# Patient Record
Sex: Female | Born: 1970 | Race: Black or African American | Hispanic: No | Marital: Single | Smoking: Never smoker
Health system: Southern US, Community
[De-identification: ages and names within clinical notes are randomized; demographics above are authoritative.]

## PROBLEM LIST (undated history)

## (undated) DIAGNOSIS — J45909 Unspecified asthma, uncomplicated: Secondary | ICD-10-CM

## (undated) HISTORY — DX: Unspecified asthma, uncomplicated: J45.909

## (undated) HISTORY — PX: MOUTH SURGERY: SHX715

## (undated) HISTORY — PX: CYST REMOVAL TRUNK: SHX6283

---

## 2017-02-11 DIAGNOSIS — L811 Chloasma: Secondary | ICD-10-CM | POA: Insufficient documentation

## 2017-02-11 DIAGNOSIS — T148XXA Other injury of unspecified body region, initial encounter: Secondary | ICD-10-CM | POA: Insufficient documentation

## 2017-05-06 DIAGNOSIS — L819 Disorder of pigmentation, unspecified: Secondary | ICD-10-CM | POA: Insufficient documentation

## 2018-05-27 ENCOUNTER — Telehealth: Payer: Self-pay | Admitting: Family Medicine

## 2018-05-27 NOTE — Telephone Encounter (Signed)
Copied from [place] [id number]. Topic: Appointment Scheduling - New Patient >> May 27, 2018  4:21 PM Loma Boston wrote: CRM for notification. See Telephone encounter for: 05/27/18. Dr Bryan Lemma has a N PT on >> May 27, 2018  4:25 PM Loma Boston wrote: New PT on 06/09/2018

## 2018-06-01 ENCOUNTER — Telehealth: Payer: Self-pay

## 2018-06-01 NOTE — Telephone Encounter (Signed)
Copied from [place] [id number]. Topic: Appointment Scheduling - New Patient >> May 27, 2018  4:25 PM Loma Boston wrote: New PT on 06/09/2018

## 2018-06-01 NOTE — Telephone Encounter (Signed)
Copied from [place] [id number]. Topic: Appointment Scheduling - New Patient >> May 27, 2018  4:21 PM Loma Boston wrote: CRM for notification. See Telephone encounter for: 05/27/18. Dr Bryan Lemma has a N PT on >> May 27, 2018  4:25 PM Loma Boston wrote: New PT on 06/09/2018

## 2018-06-09 ENCOUNTER — Ambulatory Visit (INDEPENDENT_AMBULATORY_CARE_PROVIDER_SITE_OTHER): Payer: BC Managed Care – PPO | Admitting: Family Medicine

## 2018-06-09 ENCOUNTER — Other Ambulatory Visit: Payer: Self-pay

## 2018-06-09 ENCOUNTER — Encounter: Payer: Self-pay | Admitting: Family Medicine

## 2018-06-09 VITALS — BP 120/78 | HR 104 | Temp 98.0°F | Ht 66.0 in | Wt 213.0 lb

## 2018-06-09 DIAGNOSIS — Z1239 Encounter for other screening for malignant neoplasm of breast: Secondary | ICD-10-CM | POA: Diagnosis not present

## 2018-06-09 DIAGNOSIS — Z2821 Immunization not carried out because of patient refusal: Secondary | ICD-10-CM | POA: Diagnosis not present

## 2018-06-09 DIAGNOSIS — Z7689 Persons encountering health services in other specified circumstances: Secondary | ICD-10-CM | POA: Diagnosis not present

## 2018-06-09 DIAGNOSIS — L732 Hidradenitis suppurativa: Secondary | ICD-10-CM

## 2018-06-09 DIAGNOSIS — L309 Dermatitis, unspecified: Secondary | ICD-10-CM | POA: Diagnosis not present

## 2018-06-09 DIAGNOSIS — Z872 Personal history of diseases of the skin and subcutaneous tissue: Secondary | ICD-10-CM

## 2018-06-09 MED ORDER — TRIAMCINOLONE ACETONIDE 0.1 % EX CREA: 1.0000 "application " | TOPICAL_CREAM | Freq: Two times a day (BID) | CUTANEOUS | 0 refills | Status: DC

## 2018-06-09 NOTE — Progress Notes (Signed)
Linda Chaney is a 48 y.o. female  Chief Complaint  Patient presents with  . Establish Care    est care/ Pt has cyst from teenager wants to discuss maybe an aluminum free cream or deodrant prescription/ cyst removed on bottom 15 years ago drainage today/ places on legs scaly very itchy and on arms/ pt states she cut her vagina when shaving and has irritation?/ last pap 2 yrs ago/ pt declined tdap and flu today    HPI: Linda Chaney is a 48 y.o. female here to establish care with our office.  She was previously seen 3 years ago at [facility].  Pt declines Tdap and flu vaccine. Pts mother developed GBS after influenza vaccine and pt states she was advised not to receive flu vaccine herself.  Specialists: Derm (Dr. Lois Huxley)   Last CPE, labs: 2017  Last PAP: 2018 Last mammo: overdue - needs and would like referral  Med refills needed today: none  Pt had pilonidal cyst removed surgically 20 years ago but states she has noted drainage from that area x 2-3 days. No pain/discomfort. Scant clear drainage on bandaid.   She has a h/o hidradenitis in B/L axilla. She has been symptom-free x 1 year. She has been using OTC aluminum free deodorant for the past month. She is looking for recommendation as to what brand if any or Rx aluminum free deodorant.  She has appt in about 2 wks with her dermatologist to discuss multiple issues  She notes "eczema patches" on her lower legs and Rt arm x a few weeks. She has been using Nivea lotion, tried OTC hydrocortisone cream. + itch.   Pt notes having sensitive skin and is not able to use topical hair removal products. She endorses shaving her pubic area and causing irritation and "breaks in the skin" that do not seem to be healing. No pain. No itch. No fever, chills.    Past Medical History:  Diagnosis Date  . Asthma     Past Surgical History:  Procedure Laterality Date  . CYST REMOVAL TRUNK      Social History   Socioeconomic  History  . Marital status: Single    Spouse name: Not on file  . Number of children: Not on file  . Years of education: Not on file  . Highest education level: Not on file  Occupational History  . Not on file  Social Needs  . Financial resource strain: Not on file  . Food insecurity:    Worry: Not on file    Inability: Not on file  . Transportation needs:    Medical: Not on file    Non-medical: Not on file  Tobacco Use  . Smoking status: Never Smoker  . Smokeless tobacco: Never Used  Substance and Sexual Activity  . Alcohol use: Yes    Comment: ocassionally  . Drug use: Never  . Sexual activity: Not on file  Lifestyle  . Physical activity:    Days per week: Not on file    Minutes per session: Not on file  . Stress: Not on file  Relationships  . Social connections:    Talks on phone: Not on file    Gets together: Not on file    Attends religious service: Not on file    Active member of club or organization: Not on file    Attends meetings of clubs or organizations: Not on file    Relationship status: Not on file  . Intimate  partner violence:    Fear of current or ex partner: Not on file    Emotionally abused: Not on file    Physically abused: Not on file    Forced sexual activity: Not on file  Other Topics Concern  . Not on file  Social History Narrative  . Not on file    Family History  Problem Relation Age of Onset  . Hypertension Mother   . Hypertension Father       There is no immunization history on file for this patient.  Outpatient Encounter Medications as of 06/09/2018  Medication Sig  . Fluocin-Hydroquinone-Tretinoin 0.01-4-0.05 % CREA Apply to affected areas on the cheek, nose, and around the mouth nightly to tolerance x 4 months  . triamcinolone cream (KENALOG) 0.1 % Apply 1 application topically 2 (two) times daily.   No facility-administered encounter medications on file as of 06/09/2018.      ROS: Pertinent positives and negatives noted in  HPI. Remainder of ROS non-contributory    No Known Allergies  BP 120/78   Pulse (!) 104   Temp 98 F (36.7 C) (Oral)   Ht 5\' 6"  (1.676 m)   Wt 213 lb (96.6 kg)   SpO2 98%   BMI 34.38 kg/m   Physical Exam  Constitutional: She is oriented to person, place, and time. She appears well-developed and well-nourished. No distress.  Neurological: She is alert and oriented to person, place, and time.  Skin: Skin is warm and dry. Rash (few discrete patches of dry skin on anterior shins with excoriation) noted.  Lower back just above gluteal cleft pt has scar/completely healed from remote pilonidal cyst excision; no erythema, warmth, drainage, TTP, no openings in skin     A/P:  1. Screening for breast cancer - MM DIGITAL SCREENING BILATERAL; Future  2. Encounter to establish care with new doctor - pt due for CPE, fasting labs - pt will schedule appt - UTD on mammo; pt states her last PAP was in 2018 and was normal and has no h/o abnormal PAP. She states she is due for a PAP and would like one done despite me reviewing screening guidelines with her. She states her insurance will cover. I am agreeable to doing a PAP at her CPE appt  3. Influenza vaccination declined by patient  4. H/O pilonidal cyst - s/p excision 20+ years ago - well-healed incision, no drainage noted  5. Hidradenitis suppurativa - controlled, no active infection or inflammation noted - pt has regular f/u with derm in 2 wks  6. Dermatitis - ? Eczema vs other - no new meds, lotion, soap, detergent, etc Rx: - triamacinolone 0.1% cream BID x 2 wks Discussed plan and reviewed medications with patient, including risks, benefits, and potential side effects. Pt expressed understand. All questions answered.

## 2019-01-17 ENCOUNTER — Telehealth: Payer: Self-pay

## 2019-01-17 NOTE — Telephone Encounter (Signed)
Copied from [place] [id number]. Topic: General - Other >> Jan 17, 2019 10:04 AM Keene Breath wrote: Reason for CRM: Patient called to request that she change her PCP to [facility] for personal reasons.  Please advise and call patient to let her know if this is possible.  CB# [phone]

## 2019-01-18 NOTE — Telephone Encounter (Signed)
Fine with me

## 2019-01-18 NOTE — Telephone Encounter (Signed)
Done

## 2019-01-18 NOTE — Telephone Encounter (Signed)
ok 

## 2019-01-18 NOTE — Telephone Encounter (Signed)
Charlotte please advise,  

## 2019-01-18 NOTE — Telephone Encounter (Signed)
Please help schedule TOC

## 2019-01-18 NOTE — Telephone Encounter (Signed)
Pt wants to transfer to [facility], Dr. Loletha Grayer is okay with transfer.

## 2019-01-25 ENCOUNTER — Encounter: Payer: BC Managed Care – PPO | Admitting: Family Medicine

## 2019-01-25 DIAGNOSIS — L732 Hidradenitis suppurativa: Secondary | ICD-10-CM | POA: Insufficient documentation

## 2019-01-25 DIAGNOSIS — L730 Acne keloid: Secondary | ICD-10-CM | POA: Insufficient documentation

## 2019-01-25 DIAGNOSIS — L700 Acne vulgaris: Secondary | ICD-10-CM | POA: Insufficient documentation

## 2019-02-01 ENCOUNTER — Encounter: Payer: Self-pay | Admitting: Nurse Practitioner

## 2019-02-01 ENCOUNTER — Ambulatory Visit (HOSPITAL_BASED_OUTPATIENT_CLINIC_OR_DEPARTMENT_OTHER): Payer: BC Managed Care – PPO

## 2019-02-01 ENCOUNTER — Other Ambulatory Visit (HOSPITAL_COMMUNITY)
Admission: RE | Admit: 2019-02-01 | Discharge: 2019-02-01 | Disposition: A | Discharge status: Home / Self Care | Payer: BC Managed Care – PPO | Source: Ambulatory Visit | Attending: Nurse Practitioner | Admitting: Nurse Practitioner

## 2019-02-01 ENCOUNTER — Encounter: Payer: BC Managed Care – PPO | Admitting: Family Medicine

## 2019-02-01 ENCOUNTER — Other Ambulatory Visit: Payer: Self-pay

## 2019-02-01 ENCOUNTER — Ambulatory Visit (INDEPENDENT_AMBULATORY_CARE_PROVIDER_SITE_OTHER): Payer: BC Managed Care – PPO | Admitting: Nurse Practitioner

## 2019-02-01 VITALS — BP 116/80 | HR 92 | Temp 97.9°F | Ht 66.0 in | Wt 214.2 lb

## 2019-02-01 DIAGNOSIS — E669 Obesity, unspecified: Secondary | ICD-10-CM | POA: Insufficient documentation

## 2019-02-01 DIAGNOSIS — Z13220 Encounter for screening for lipoid disorders: Secondary | ICD-10-CM | POA: Diagnosis not present

## 2019-02-01 DIAGNOSIS — Z0001 Encounter for general adult medical examination with abnormal findings: Secondary | ICD-10-CM

## 2019-02-01 DIAGNOSIS — R9389 Abnormal findings on diagnostic imaging of other specified body structures: Secondary | ICD-10-CM

## 2019-02-01 DIAGNOSIS — Z1231 Encounter for screening mammogram for malignant neoplasm of breast: Secondary | ICD-10-CM

## 2019-02-01 DIAGNOSIS — Z124 Encounter for screening for malignant neoplasm of cervix: Secondary | ICD-10-CM | POA: Diagnosis present

## 2019-02-01 DIAGNOSIS — N926 Irregular menstruation, unspecified: Secondary | ICD-10-CM | POA: Diagnosis not present

## 2019-02-01 DIAGNOSIS — D259 Leiomyoma of uterus, unspecified: Secondary | ICD-10-CM | POA: Insufficient documentation

## 2019-02-01 DIAGNOSIS — R1900 Intra-abdominal and pelvic swelling, mass and lump, unspecified site: Secondary | ICD-10-CM

## 2019-02-01 DIAGNOSIS — Z0000 Encounter for general adult medical examination without abnormal findings: Secondary | ICD-10-CM | POA: Insufficient documentation

## 2019-02-01 DIAGNOSIS — Z6834 Body mass index (BMI) 34.0-34.9, adult: Secondary | ICD-10-CM | POA: Diagnosis not present

## 2019-02-01 DIAGNOSIS — Z136 Encounter for screening for cardiovascular disorders: Secondary | ICD-10-CM

## 2019-02-01 LAB — CBC WITH DIFFERENTIAL/PLATELET
Basophils Absolute: 0 10*3/uL (ref 0.0–0.1)
Basophils Relative: 0.8 % (ref 0.0–3.0)
Eosinophils Absolute: 0.1 10*3/uL (ref 0.0–0.7)
Eosinophils Relative: 2.4 % (ref 0.0–5.0)
HCT: 41 % (ref 36.0–46.0)
Hemoglobin: 13.4 g/dL (ref 12.0–15.0)
Lymphocytes Relative: 30.6 % (ref 12.0–46.0)
Lymphs Abs: 1.3 10*3/uL (ref 0.7–4.0)
MCHC: 32.8 g/dL (ref 30.0–36.0)
MCV: 96.4 fl (ref 78.0–100.0)
Monocytes Absolute: 0.4 10*3/uL (ref 0.1–1.0)
Monocytes Relative: 10.7 % (ref 3.0–12.0)
Neutro Abs: 2.3 10*3/uL (ref 1.4–7.7)
Neutrophils Relative %: 55.5 % (ref 43.0–77.0)
Platelets: 304 10*3/uL (ref 150.0–400.0)
RBC: 4.25 Mil/uL (ref 3.87–5.11)
RDW: 14.4 % (ref 11.5–15.5)
WBC: 4.1 10*3/uL (ref 4.0–10.5)

## 2019-02-01 LAB — COMPREHENSIVE METABOLIC PANEL
ALT: 25 U/L (ref 0–35)
AST: 22 U/L (ref 0–37)
Albumin: 4.1 g/dL (ref 3.5–5.2)
Alkaline Phosphatase: 82 U/L (ref 39–117)
BUN: 12 mg/dL (ref 6–23)
CO2: 26 mEq/L (ref 19–32)
Calcium: 9 mg/dL (ref 8.4–10.5)
Chloride: 105 mEq/L (ref 96–112)
Creatinine, Ser: 0.63 mg/dL (ref 0.40–1.20)
GFR: 122.14 mL/min (ref >60.00)
Glucose, Bld: 94 mg/dL (ref 70–99)
Potassium: 4.3 mEq/L (ref 3.5–5.1)
Sodium: 138 mEq/L (ref 135–145)
Total Bilirubin: 0.6 mg/dL (ref 0.2–1.2)
Total Protein: 7.4 g/dL (ref 6.0–8.3)

## 2019-02-01 LAB — POCT URINE PREGNANCY: Preg Test, Ur: NEGATIVE

## 2019-02-01 LAB — LIPID PANEL
Cholesterol: 201 mg/dL — ABNORMAL HIGH (ref 0–200)
HDL: 56.6 mg/dL (ref >39.00)
LDL Cholesterol: 133 mg/dL — ABNORMAL HIGH (ref 0–99)
NonHDL: 144.04
Total CHOL/HDL Ratio: 4
Triglycerides: 54 mg/dL (ref 0.0–149.0)
VLDL: 10.8 mg/dL (ref 0.0–40.0)

## 2019-02-01 LAB — TSH: TSH: 1.51 u[IU]/mL (ref 0.35–4.50)

## 2019-02-01 NOTE — Progress Notes (Addendum)
Subjective:    Patient ID: Linda Chaney, female    DOB: Jun 26, 1970, 48 y.o.   MRN: [id number]  Patient presents today for complete physical and eval of obesity and irregular cycle  Vaginal Bleeding The patient's primary symptoms include missed menses and vaginal bleeding. The patient's pertinent negatives include no genital itching, genital lesions, genital odor, genital rash, pelvic pain or vaginal discharge. This is a chronic problem. The current episode started more than 1 year ago. The problem occurs intermittently. The problem has been unchanged. The patient is experiencing no pain. She is not pregnant. Pertinent negatives include no anorexia, back pain, constipation, diarrhea, dysuria, fever, flank pain, frequency, headaches, hematuria, joint pain, painful intercourse, rash, sore throat or urgency. The vaginal discharge was bloody. The vaginal bleeding is typical of menses. She has been passing clots. She has not been passing tissue. Nothing aggravates the symptoms. She has tried nothing for the symptoms. She is sexually active. No, her partner does not have an STD. She uses condoms for contraception. Her menstrual history has been irregular. Her past medical history is significant for menorrhagia. There is no history of endometriosis, ovarian cysts, PID, an STD or vaginosis.  menarche at age 25. 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 Cycles were heavy with clots, then became irregular in her 30s. Mother, maternal aunt and cousins with uterine fibroids. No FHx of cervical or ovarian or uterine cancer.  She has hx of adult acne, hidradenitis suppurativa, melasma and facial hair (chin). This is managed by dermatology with clindamycin and tru-lima cream. She has an OV every 3-74months.  Sexual History (orientation,birth control, marital status, STD):last PAP 2018 (normal per patient), never had mammogram, female partner, use of condoms.  Obesity: BMI of 34 Reports  difficulty with weight loss despite lifestyle modifications in last 1year Lowest weight 120lbs (age 37s) Heaviest weight: 214lbs Diet:heart healthy. Weight:  Wt Readings from Last 3 Encounters:  02/01/19 214 lb 3.2 oz (97.2 kg)  06/09/18 213 lb (96.6 kg)  Exercise:high intensity workout with trainer 4x/week  Depression/Suicide: Depression screen PHQ 2/9 02/01/2019  Decreased Interest 0  Down, Depressed, Hopeless 0  PHQ - 2 Score 0   Vision: up to date  Dental:up to date  Immunizations: (TDAP, Hep C screen, Pneumovax, Influenza, zoster)  Health Maintenance  Topic Date Due  . Tetanus Vaccine  04/10/1990  . Flu Shot  06/28/2019*  . HIV Screening  02/01/2020*  . Pap Smear  01/31/2022  *Topic was postponed. The date shown is not the original due date.   Fall Risk: Fall Risk  02/01/2019  Falls in the past year? 0    Medications and allergies reviewed with patient and updated if appropriate.  Patient Active Problem List   Diagnosis Date Noted  . Class 1 obesity with serious comorbidity and body mass index (BMI) of 34.0 to 34.9 in adult 02/01/2019  . Pelvic mass in female 02/01/2019  . Irregular menstrual cycle 02/01/2019  . Hidradenitis suppurativa 01/25/2019  . 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 Acne vulgaris 01/25/2019  . Hyperpigmentation 05/06/2017  . Melasma 02/11/2017  . Traumatic injury to skin or subcutaneous tissue 02/11/2017    Current Outpatient Medications on File Prior to Visit  Medication Sig Dispense Refill  . clindamycin (CLEOCIN T) 1 % external solution APPLY TOPICALLY TO UNDERARMS AND GROIN BID UTD    . Fluocin-Hydroquinone-Tretinoin 0.01-4-0.05 % CREA Apply to affected areas on the cheek, nose, and around the mouth nightly to tolerance  x 4 months    . triamcinolone cream (KENALOG) 0.1 % Apply 1 application topically 2 (two) times daily. (Patient not taking: Reported on 02/01/2019) 30 g 0   No current facility-administered medications on file prior to  visit.     Past Medical History:  Diagnosis Date  . Asthma     Past Surgical History:  Procedure Laterality Date  . CYST REMOVAL TRUNK      Social History   Socioeconomic History  . Marital status: Single    Spouse name: Not on file  . Number of children: 0  . Years of education: Not on file  . Highest education level: Not on file  Occupational History  . Not on file  Social Needs  . Financial resource strain: Not on file  . Food insecurity    Worry: Not on file    Inability: Not on file  . Transportation needs    Medical: Not on file    Non-medical: Not on file  Tobacco Use  . Smoking status: Never Smoker  . Smokeless tobacco: Never Used  Substance and Sexual Activity  . Alcohol use: Yes    Comment: ocassionally  . Drug use: Never  . Sexual activity: Yes    Birth control/protection: Condom  Lifestyle  . Physical activity    Days per week: Not on file    Minutes per session: Not on file  . Stress: Not on file  Relationships  . Social Herbalist on phone: Not on file    Gets together: Not on file    Attends religious service: Not on file    Active member of club or organization: Not on file    Attends meetings of clubs or organizations: Not on file    Relationship status: Not on file  Other Topics Concern  . Not on file  Social History Narrative  . Not on file    Family History  Problem Relation Age of Onset  . Hypertension Mother   . Other Mother        [name]  . Hypertension Father   . Cancer Paternal Grandfather        lung cancer due to tobacco use        Review of Systems  Constitutional: Negative for fever, malaise/fatigue and weight loss.  HENT: Negative for congestion and sore throat.   Eyes:       Negative for visual changes  Respiratory: Negative for cough and shortness of breath.   Cardiovascular: Negative for chest pain, palpitations and leg swelling.  Gastrointestinal: Negative for anorexia, blood in stool,  constipation, diarrhea and heartburn.  Genitourinary: Positive for menorrhagia, missed menses and vaginal bleeding. Negative for dysuria, flank pain, frequency, hematuria, pelvic pain, urgency and vaginal discharge.  Musculoskeletal: Negative for back pain, falls, joint pain and myalgias.  Skin: Negative for rash.  Neurological: Negative for dizziness, sensory change and headaches.  Endo/Heme/Allergies: Does not bruise/bleed easily.  Psychiatric/Behavioral: Negative for depression, substance abuse and suicidal ideas. The patient is not nervous/anxious.     Objective:   Vitals:   02/01/19 1111  BP: 116/80  Pulse: 92  Temp: 97.9 F (36.6 C)  SpO2: 96%    Body mass index is 34.57 kg/m.   Physical Examination:  Physical Exam Exam conducted with a chaperone present.  Constitutional:      General: She is not in acute distress.    Appearance: She is obese.  HENT:     Right  Ear: Tympanic membrane, ear canal and external ear normal.     Left Ear: Tympanic membrane, ear canal and external ear normal.  Eyes:     General: No scleral icterus.    Extraocular Movements: Extraocular movements intact.     Conjunctiva/sclera: Conjunctivae normal.     Pupils: Pupils are equal, round, and reactive to light.  Neck:     Musculoskeletal: Normal range of motion and neck supple.     Thyroid: No thyromegaly.  Cardiovascular:     Rate and Rhythm: Normal rate and regular rhythm.     Pulses: Normal pulses.     Heart sounds: Normal heart sounds.  Pulmonary:     Effort: Pulmonary effort is normal.     Breath sounds: Normal breath sounds.  Chest:     Chest wall: No tenderness.     Breasts:        Right: Normal.        Left: Normal.  Abdominal:     General: Bowel sounds are normal. There is no distension.     Palpations: Abdomen is soft. There is mass.     Tenderness: There is no abdominal tenderness. There is no right CVA tenderness, left CVA tenderness or guarding.     Hernia: No hernia is  present. There is no hernia in the left inguinal area or right inguinal area.  Genitourinary:    Labia:        Right: Lesion present. No tenderness.        Left: Lesion present. No tenderness.      Vagina: Bleeding present. No tenderness.     Cervix: Normal.     Uterus: Enlarged and fixed.      Adnexa: Right adnexa normal and left adnexa normal.  Musculoskeletal: Normal range of motion.        General: No tenderness.     Right lower leg: No edema.     Left lower leg: Edema present.  Lymphadenopathy:     Cervical: No cervical adenopathy.     Upper Body:     Right upper body: No supraclavicular, axillary or pectoral adenopathy.     Left upper body: No supraclavicular, axillary or pectoral adenopathy.     Lower Body: No right inguinal adenopathy. No left inguinal adenopathy.  Skin:    General: Skin is warm and dry.     Findings: Lesion present. No erythema.     Comments: Scarring in axilla and groin region.   Neurological:     Mental Status: She is alert and oriented to person, place, and time.  Psychiatric:        Judgment: Judgment normal.    ASSESSMENT and PLAN:  Christasia was seen today for annual exam.  Diagnoses and all orders for this visit:  Encounter for preventative adult health care exam with abnormal findings -     Cancel: CBC -     Comprehensive metabolic panel -     Lipid panel -     TSH -     Cytology - PAP( [place]) -     CBC w/Diff -     Cancel: MM DIGITAL SCREENING BILATERAL; Future -     MM 3D SCREEN BREAST BILATERAL; Future  Encounter for lipid screening for cardiovascular disease -     Lipid panel  Encounter for Papanicolaou smear for cervical cancer screening -     Cytology - PAP( [place])  Irregular menstrual cycle -     POCT urine pregnancy -  US Pelvic Complete With Transvaginal; Future -     Iron, TIBC and Ferritin Panel -     MR Pelvis W Wo Contrast; Future  Pelvic mass in female -     US Pelvic Complete With Transvaginal;  Future -     MR Pelvis W Wo Contrast; Future  Class 1 obesity with serious comorbidity and body mass index (BMI) of 34.0 to 34.9 in adult, unspecified obesity type -     Vitamin D 1,25 dihydroxy  Breast cancer screening by mammogram -     Cancel: MM DIGITAL SCREENING BILATERAL; Future -     MM 3D SCREEN BREAST BILATERAL; Future  Abnormal pelvic ultrasound -     MR Pelvis W Wo Contrast; Future    Irregular menstrual cycle menarche at age 89. 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 Cycles were heavy with clots, then became irregular in her 30s. Mother, maternal aunt and cousins with uterine fibroids. No FHx of cervical or ovarian or uterine cancer.  Negative urine pregnancy today Pelvic US ordered Consider testosterone, estrogen check?  Pelvic mass in female Palpable in suprapubic region, non tender Mother, maternal aunt and cousins with uterine fibroids. No FHx of cervical or ovarian or uterine cancer.  Negative urine pregnancy. Pelvic US ordered Consider referral to GYN?  Class 1 obesity with serious comorbidity and body mass index (BMI) of 34.0 to 34.9 in adult BMI of 34 Reports difficulty with weight loss despite lifestyle modifications in last 1year Lowest weight 120lbs (age 33s) Heaviest weight: 214lbs Diet:heart healthy. Weight:  Wt Readings from Last 3 Encounters:  02/01/19 214 lb 3.2 oz (97.2 kg)  06/09/18 213 lb (96.6 kg)  Exercise:high intensity workout with trainer 4x/week hx of adult acne, hidradenitis suppurativa, melasma and facial hair. managed by dermatology with clindamycin and tru-lima cream. She has an OV every 3-43months.  Cmp, tsh, and lipid panel check today Consider hgbA1c if elevated glucose, testosterone, and estrogen. Pelvic US ordered today.      Problem List Items Addressed This Visit      Other   Class 1 obesity with serious comorbidity and body mass index (BMI) of 34.0 to 34.9 in adult    BMI of 34  Reports difficulty with weight loss despite lifestyle modifications in last 1year Lowest weight 120lbs (age 43s) Heaviest weight: 214lbs Diet:heart healthy. Weight:  Wt Readings from Last 3 Encounters:  02/01/19 214 lb 3.2 oz (97.2 kg)  06/09/18 213 lb (96.6 kg)  Exercise:high intensity workout with trainer 4x/week hx of adult acne, hidradenitis suppurativa, melasma and facial hair. managed by dermatology with clindamycin and tru-lima cream. She has an OV every 3-1months.  Cmp, tsh, and lipid panel check today Consider hgbA1c if elevated glucose, testosterone, and estrogen. Pelvic US ordered today.      Relevant Orders   Vitamin D 1,25 dihydroxy (Completed)   Irregular menstrual cycle    menarche at age 48. 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 Cycles were heavy with clots, then became irregular in her 30s. Mother, maternal aunt and cousins with uterine fibroids. No FHx of cervical or ovarian or uterine cancer.  Negative urine pregnancy today Pelvic US ordered Consider testosterone, estrogen check?      Relevant Orders   POCT urine pregnancy (Completed)   US Pelvic Complete With Transvaginal (Completed)   Iron, TIBC and Ferritin Panel (Completed)   MR Pelvis W Wo Contrast   Pelvic mass in  female    Palpable in suprapubic region, non tender Mother, maternal aunt and cousins with uterine fibroids. No FHx of cervical or ovarian or uterine cancer.  Negative urine pregnancy. Pelvic US ordered Consider referral to GYN?      Relevant Orders   US Pelvic Complete With Transvaginal (Completed)   MR Pelvis W Wo Contrast    Other Visit Diagnoses    Encounter for preventative adult health care exam with abnormal findings    -  Primary   Relevant Orders   Comprehensive metabolic panel (Completed)   Lipid panel (Completed)   TSH (Completed)   Cytology - PAP( [place]) (Completed)   CBC w/Diff (Completed)   MM 3D SCREEN BREAST BILATERAL  (Completed)   Encounter for lipid screening for cardiovascular disease       Relevant Orders   Lipid panel (Completed)   Encounter for Papanicolaou smear for cervical cancer screening       Relevant Orders   Cytology - PAP( [place]) (Completed)   Breast cancer screening by mammogram       Relevant Orders   MM 3D SCREEN BREAST BILATERAL (Completed)   Abnormal pelvic ultrasound       Relevant Orders   MR Pelvis W Wo Contrast      Follow up: Return if symptoms worsen or fail to improve.  Wilfred Lacy, NP

## 2019-02-01 NOTE — Patient Instructions (Addendum)
Thank you for choosing [facility] for your heal care needs.  Go to lab for blood draw. You will be contacted to schedule appt for pelvic US.   Health Maintenance, Female Adopting a healthy lifestyle and getting preventive care are important in promoting health and wellness. Ask your health care provider about:  The right schedule for you to have regular tests and exams.  Things you can do on your own to prevent diseases and keep yourself healthy. What should I know about diet, weight, and exercise? Eat a healthy diet   Eat a diet that includes plenty of vegetables, fruits, low-fat dairy products, and lean protein.  Do not eat a lot of foods that are high in solid fats, added sugars, or sodium. Maintain a healthy weight Body mass index (BMI) is used to identify weight problems. It estimates body fat based on height and weight. Your health care provider can help determine your BMI and help you achieve or maintain a healthy weight. Get regular exercise Get regular exercise. This is one of the most important things you can do for your health. Most adults should:  Exercise for at least 150 minutes each week. The exercise should increase your heart rate and make you sweat (moderate-intensity exercise).  Do strengthening exercises at least twice a week. This is in addition to the moderate-intensity exercise.  Spend less time sitting. Even light physical activity can be beneficial. Watch cholesterol and blood lipids Have your blood tested for lipids and cholesterol at 47 years of age, then have this test every 5 years. Have your cholesterol levels checked more often if:  Your lipid or cholesterol levels are high.  You are older than 47 years of age.  You are at high risk for heart disease. What should I know about cancer screening? Depending on your health history and family history, you may need to have cancer screening at various ages. This may include screening for:  Breast  cancer.  Cervical cancer.  Colorectal cancer.  Skin cancer.  Lung cancer. What should I know about heart disease, diabetes, and high blood pressure? Blood pressure and heart disease  High blood pressure causes heart disease and increases the risk of stroke. This is more likely to develop in people who have high blood pressure readings, are of African descent, or are overweight.  Have your blood pressure checked: ? Every 3-5 years if you are 29-25 years of age. ? Every year if you are 57 years old or older. Diabetes Have regular diabetes screenings. This checks your fasting blood sugar level. Have the screening done:  Once every three years after age 36 if you are at a normal weight and have a low risk for diabetes.  More often and at a younger age if you are overweight or have a high risk for diabetes. What should I know about preventing infection? Hepatitis B If you have a higher risk for hepatitis B, you should be screened for this virus. Talk with your health care provider to find out if you are at risk for hepatitis B infection. Hepatitis C Testing is recommended for:  Everyone born from [birth year] through 1964.  Anyone with known risk factors for hepatitis C. Sexually transmitted infections (STIs)  Get screened for STIs, including gonorrhea and chlamydia, if: ? You are sexually active and are younger than 47 years of age. ? You are older than 47 years of age and your health care provider tells you that you are at risk for this type  of infection. ? Your sexual activity has changed since you were last screened, and you are at increased risk for chlamydia or gonorrhea. Ask your health care provider if you are at risk.  Ask your health care provider about whether you are at high risk for HIV. Your health care provider may recommend a prescription medicine to help prevent HIV infection. If you choose to take medicine to prevent HIV, you should first get tested for HIV. You should  then be tested every 3 months for as long as you are taking the medicine. Pregnancy  If you are about to stop having your period (premenopausal) and you may become pregnant, seek counseling before you get pregnant.  Take 400 to 800 micrograms (mcg) of folic acid every day if you become pregnant.  Ask for birth control (contraception) if you want to prevent pregnancy. Osteoporosis and menopause Osteoporosis is a disease in which the bones lose minerals and strength with aging. This can result in bone fractures. If you are 80 years old or older, or if you are at risk for osteoporosis and fractures, ask your health care provider if you should:  Be screened for bone loss.  Take a calcium or vitamin D supplement to lower your risk of fractures.  Be given hormone replacement therapy (HRT) to treat symptoms of menopause. Follow these instructions at home: Lifestyle  Do not use any products that contain nicotine or tobacco, such as cigarettes, e-cigarettes, and chewing tobacco. If you need help quitting, ask your health care provider.  Do not use street drugs.  Do not share needles.  Ask your health care provider for help if you need support or information about quitting drugs. Alcohol use  Do not drink alcohol if: ? Your health care provider tells you not to drink. ? You are pregnant, may be pregnant, or are planning to become pregnant.  If you drink alcohol: ? Limit how much you use to 0-1 drink a day. ? Limit intake if you are breastfeeding.  Be aware of how much alcohol is in your drink. In the [place] one drink equals one 12 oz bottle of beer (355 mL), one 5 oz glass of wine (148 mL), or one 1 oz glass of hard liquor (44 mL). General instructions  Schedule regular health, dental, and eye exams.  Stay current with your vaccines.  Tell your health care provider if: ? You often feel depressed. ? You have ever been abused or do not feel safe at home. Summary  Adopting a  healthy lifestyle and getting preventive care are important in promoting health and wellness.  Follow your health care provider's instructions about healthy diet, exercising, and getting tested or screened for diseases.  Follow your health care provider's instructions on monitoring your cholesterol and blood pressure. This information is not intended to replace advice given to you by your health care provider. Make sure you discuss any questions you have with your health care provider. Document Released: 09/29/2010 Document Revised: 03/09/2018 Document Reviewed: 03/09/2018 Elsevier Patient Education  2020 [employer].

## 2019-02-01 NOTE — Assessment & Plan Note (Signed)
BMI of 34 Reports difficulty with weight loss despite lifestyle modifications in last 1year Lowest weight 120lbs (age 59s) Heaviest weight: 214lbs Diet:heart healthy. Weight:  Wt Readings from Last 3 Encounters:  02/01/19 214 lb 3.2 oz (97.2 kg)  06/09/18 213 lb (96.6 kg)  Exercise:high intensity workout with trainer 4x/week hx of adult acne, hidradenitis suppurativa, melasma and facial hair. managed by dermatology with clindamycin and tru-lima cream. She has an OV every 3-86months.  Cmp, tsh, and lipid panel check today Consider hgbA1c if elevated glucose, testosterone, and estrogen. Pelvic US ordered today.

## 2019-02-01 NOTE — Assessment & Plan Note (Signed)
menarche at age 48. 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 Cycles were heavy with clots, then became irregular in her 30s. Mother, maternal aunt and cousins with uterine fibroids. No FHx of cervical or ovarian or uterine cancer.  Negative urine pregnancy today Pelvic US ordered Consider testosterone, estrogen check?

## 2019-02-01 NOTE — Assessment & Plan Note (Signed)
Palpable in suprapubic region, non tender Mother, maternal aunt and cousins with uterine fibroids. No FHx of cervical or ovarian or uterine cancer.  Negative urine pregnancy. Pelvic US ordered Consider referral to GYN?

## 2019-02-03 LAB — CYTOLOGY - PAP
Comment: NEGATIVE
Diagnosis: NEGATIVE
High risk HPV: NEGATIVE

## 2019-02-05 LAB — IRON,TIBC AND FERRITIN PANEL
%SAT: 39 % (calc) (ref 16–45)
Ferritin: 127 ng/mL (ref 16–232)
Iron: 122 ug/dL (ref 40–190)
TIBC: 315 mcg/dL (calc) (ref 250–450)

## 2019-02-05 LAB — VITAMIN D 1,25 DIHYDROXY
Vitamin D 1, 25 (OH)2 Total: 57 pg/mL (ref 18–72)
Vitamin D2 1, 25 (OH)2: <8 pg/mL
Vitamin D3 1, 25 (OH)2: 57 pg/mL

## 2019-02-06 ENCOUNTER — Encounter: Payer: Self-pay | Admitting: Nurse Practitioner

## 2019-02-07 ENCOUNTER — Other Ambulatory Visit: Payer: Self-pay

## 2019-02-07 ENCOUNTER — Encounter (HOSPITAL_BASED_OUTPATIENT_CLINIC_OR_DEPARTMENT_OTHER): Payer: Self-pay

## 2019-02-07 ENCOUNTER — Other Ambulatory Visit: Payer: Self-pay | Admitting: Nurse Practitioner

## 2019-02-07 ENCOUNTER — Ambulatory Visit (HOSPITAL_BASED_OUTPATIENT_CLINIC_OR_DEPARTMENT_OTHER)
Admission: RE | Admit: 2019-02-07 | Discharge: 2019-02-07 | Disposition: A | Discharge status: Home / Self Care | Payer: BC Managed Care – PPO | Source: Ambulatory Visit | Attending: Nurse Practitioner | Admitting: Nurse Practitioner

## 2019-02-07 ENCOUNTER — Encounter: Payer: Self-pay | Admitting: Nurse Practitioner

## 2019-02-07 ENCOUNTER — Telehealth: Payer: Self-pay | Admitting: *Deleted

## 2019-02-07 DIAGNOSIS — R1900 Intra-abdominal and pelvic swelling, mass and lump, unspecified site: Secondary | ICD-10-CM | POA: Diagnosis present

## 2019-02-07 DIAGNOSIS — Z0001 Encounter for general adult medical examination with abnormal findings: Secondary | ICD-10-CM | POA: Insufficient documentation

## 2019-02-07 DIAGNOSIS — N926 Irregular menstruation, unspecified: Secondary | ICD-10-CM

## 2019-02-07 DIAGNOSIS — Z1231 Encounter for screening mammogram for malignant neoplasm of breast: Secondary | ICD-10-CM | POA: Insufficient documentation

## 2019-02-07 NOTE — Telephone Encounter (Signed)
Stacy called from radiology with an imaging report of a pelvic ultra sound of this patient. [facility] at [facility] notified and spoke with Edd Arbour regarding results and recommendation of radiologist. Routing to the practice for review.

## 2019-02-07 NOTE — Progress Notes (Signed)
[place] confirms pelvic mass. It is possibly a uterine fibroid but [place] does not have good visualization of mass' origin from the uterus. MRI with contrast is recommended to further evaluate. Order entered

## 2019-02-07 NOTE — Telephone Encounter (Signed)
Please see message . Thank you .

## 2019-02-08 NOTE — Telephone Encounter (Signed)
Patient has already been notified of results and MRI ABD was ordered

## 2019-02-09 ENCOUNTER — Telehealth: Payer: Self-pay | Admitting: Nurse Practitioner

## 2019-02-09 DIAGNOSIS — R1900 Intra-abdominal and pelvic swelling, mass and lump, unspecified site: Secondary | ICD-10-CM

## 2019-02-09 DIAGNOSIS — N926 Irregular menstruation, unspecified: Secondary | ICD-10-CM

## 2019-02-09 NOTE — Telephone Encounter (Signed)
Unable to leave vm, need to inform the pt of Charlotte's message below. [place] for triage nurse to give detail message.

## 2019-02-09 NOTE — Telephone Encounter (Signed)
Please inform Ms. Hoeschen that her insurance declined MRI pelvis. I will recommend scheduling an appt with GYN to see if they have any additional suggestions. Referral entered.

## 2019-02-10 NOTE — Telephone Encounter (Signed)
Pt is aware and schedule with GYN already. Pt was wondering why insurance is not paying for the MRI? And she does not need to keep the MRI appt in 03/01/2019 right? Please advise, do you know

## 2019-02-10 NOTE — Telephone Encounter (Signed)
I was not given a reason why MRI pelvis was denied, nor did they tell me if CT will be covered or not. I was just informed that I could not appeal the decision.

## 2019-02-10 NOTE — Telephone Encounter (Signed)
Patient is requesting a call back from [facility] Call back [phone]

## 2019-02-13 NOTE — Addendum Note (Signed)
Addended by: Wilfred Lacy L on: 02/13/2019 10:39 AM   Modules accepted: Orders

## 2019-02-13 NOTE — Telephone Encounter (Signed)
LVM for the pt to call back.

## 2019-02-14 NOTE — Telephone Encounter (Signed)
Linda Chaney stated they going to reopen the case and she going to refax all informations and try to get it to approve.

## 2019-03-01 ENCOUNTER — Other Ambulatory Visit: Payer: BC Managed Care – PPO

## 2019-03-03 ENCOUNTER — Encounter: Payer: Self-pay | Admitting: Obstetrics & Gynecology

## 2019-03-03 ENCOUNTER — Ambulatory Visit: Payer: BC Managed Care – PPO | Admitting: Obstetrics & Gynecology

## 2019-03-03 ENCOUNTER — Other Ambulatory Visit: Payer: Self-pay

## 2019-03-03 VITALS — BP 128/84 | Ht 66.0 in | Wt 208.0 lb

## 2019-03-03 DIAGNOSIS — N858 Other specified noninflammatory disorders of uterus: Secondary | ICD-10-CM | POA: Diagnosis not present

## 2019-03-03 NOTE — Progress Notes (Signed)
Linda Chaney 03-22-1971 [id number]   History:    48 y.o. G0 Stable boyfriend x 3 yrs  RP:  New patient presenting for a large uterine abdominopelvic mass  HPI: Patient had no abdominopelvic pain, just had an episode of metrorrhagia, menses otherwise with normal flow every month.  A Pelvic US was done for irregular menses 02/07/19 showing a very large abdominopelvic mass attached to the uterus by a peduncle, measured at 19.6 x 10.1 x 13.7 cm.  An MRI of the pelvis is scheduled 03/13/2019.  No pain with IC.  Urine/BMs normal.    Past medical history,surgical history, family history and social history were all reviewed and documented in the EPIC chart.  Gynecologic History Patient's last menstrual period was 02/02/2019. Contraception: condoms Last Pap: 01/2019. Results were: Negative Last mammogram: 01/2019. Results were: Negative Bone Density:  Never Colonoscopy: Never  Obstetric History OB History  Gravida Para Term Preterm AB Living  0 0 0 0 0 0  SAB TAB Ectopic Multiple Live Births  0 0 0 0 0     ROS: A ROS was performed and pertinent positives and negatives are included in the history.  GENERAL: No fevers or chills. HEENT: No change in vision, no earache, sore throat or sinus congestion. NECK: No pain or stiffness. CARDIOVASCULAR: No chest pain or pressure. No palpitations. PULMONARY: No shortness of breath, cough or wheeze. GASTROINTESTINAL: No abdominal pain, nausea, vomiting or diarrhea, melena or bright red blood per rectum. GENITOURINARY: No urinary frequency, urgency, hesitancy or dysuria. MUSCULOSKELETAL: No joint or muscle pain, no back pain, no recent trauma. DERMATOLOGIC: No rash, no itching, no lesions. ENDOCRINE: No polyuria, polydipsia, no heat or cold intolerance. No recent change in weight. HEMATOLOGICAL: No anemia or easy bruising or bleeding. NEUROLOGIC: No headache, seizures, numbness, tingling or weakness. PSYCHIATRIC: No depression, no loss of interest in normal  activity or change in sleep pattern.     Exam:   BP 128/84   Ht 5\' 6"  (1.676 m)   Wt 208 lb (94.3 kg)   LMP 02/02/2019   BMI 33.57 kg/m   Body mass index is 33.57 kg/m.  General appearance : Well developed well nourished female. No acute distress  Abdomen: Not distended, no tenderness, no rebound or guarding.  Large mass from pelvis to lower border of the Rt rib cage.  Extremities: no edema or skin discoloration or tenderness  Pelvic: Vulva: Normal             Vagina: No gross lesions or discharge  Cervix: No gross lesions or discharge  Uterus  Difficult to evaluate because of a large mobile mass in the pelvis towards the right and extending abdominally all the way to the lower rib cage on the right.  Adnexa  Filled with the large mobile mass  Anus: Normal  Pelvic US 02/07/2019:  Uterus: Measurements: 7.6 x 3.5 x 5.1 cm = volume: 70 mL. Large soft tissue mass identified anteriorly within the pelvis, adjacent to the upper uterus, question large leiomyoma. This is suboptimally visualized due to bowel gas, questionably measuring 19.6 x 10.1 x 13.7 cm. It is confirm the origin of this mass as arising from the uterus. Remainder of uterus is displaced posteriorly, retroflexed. No additional masses. Endometrium: Thickness: 6 mm.  No endometrial fluid or focal abnormality. Right ovary: Measurements: 2.8 x 2.3 x 2.1 cm = volume: 7 mL. Small hemorrhagic cyst within RIGHT ovary 2.1 x 1.8 x 1.8 cm. No additional RIGHT ovarian masses. Left  ovary: Measurements: 2.5 x 1.1 x 1.7 cm = volume: 3 mL. Normal morphology without mass. Small amount of free pelvic fluid.  No other adnexal masses.    Assessment/Plan:  48 y.o. female   1. Uterine mass No abdominal pelvic pain.  A pelvic ultrasound was done to investigate mild metrorrhagia.  Large uterine mass per pelvic ultrasound in November 2020, most likely a pedunculated fibroid.  The pelvic ultrasound was otherwise normal.  We will  confirmed a benign appearance with an MRI.  MRI of Abdomen and Pelvis scheduled 03/13/2019.  Follow-up preop to discuss the MRI findings and reviewed the labs done today including an LDH, CA-125 and CEA.  If all is pointing towards a benign lesion, will organize a laparotomy with me.  Will make the final decision as to whether removing only the mass or proceeding with a TAH with bilateral salpingectomy.  Patient voiced understanding of the plan and agreement. - Lactate Dehydrogenase (LDH) - CA 125 - CEA  Counseling on above issues and coordination of care more than 50% for 45 minutes.  Princess Bruins MD, 10:19 AM 03/03/2019

## 2019-03-05 ENCOUNTER — Encounter: Payer: Self-pay | Admitting: Obstetrics & Gynecology

## 2019-03-05 NOTE — Patient Instructions (Signed)
1. Uterine mass No abdominal pelvic pain.  A pelvic ultrasound was done to investigate mild metrorrhagia.  Large uterine mass per pelvic ultrasound in November 2020, most likely a pedunculated fibroid.  The pelvic ultrasound was otherwise normal.  We will confirmed a benign appearance with an MRI.  MRI of Abdomen and Pelvis scheduled 03/13/2019.  Follow-up preop to discuss the MRI findings and reviewed the labs done today including an LDH, CA-125 and CEA.  If all is pointing towards a benign lesion, will organize a laparotomy with me.  Will make the final decision as to whether removing only the mass or proceeding with a TAH with bilateral salpingectomy.  Patient voiced understanding of the plan and agreement. - Lactate Dehydrogenase (LDH) - CA 125 - CEA  Linda Chaney, it was a pleasure meeting you today!  I will inform you of your results as soon as they are available.

## 2019-03-06 ENCOUNTER — Telehealth: Payer: Self-pay

## 2019-03-06 NOTE — Telephone Encounter (Signed)
I called and left message for patient to call me regarding scheduling.

## 2019-03-13 ENCOUNTER — Ambulatory Visit
Admission: RE | Admit: 2019-03-13 | Discharge: 2019-03-13 | Disposition: A | Discharge status: Home / Self Care | Payer: BC Managed Care – PPO | Source: Ambulatory Visit | Attending: Nurse Practitioner | Admitting: Nurse Practitioner

## 2019-03-13 DIAGNOSIS — R9389 Abnormal findings on diagnostic imaging of other specified body structures: Secondary | ICD-10-CM

## 2019-03-13 DIAGNOSIS — R1900 Intra-abdominal and pelvic swelling, mass and lump, unspecified site: Secondary | ICD-10-CM

## 2019-03-13 DIAGNOSIS — N926 Irregular menstruation, unspecified: Secondary | ICD-10-CM

## 2019-03-13 MED ORDER — GADOBENATE DIMEGLUMINE 529 MG/ML IV SOLN
20.0000 mL | Freq: Once | INTRAVENOUS | Status: AC | PRN
  Administered 2019-03-13: 18:00:00 20 mL via INTRAVENOUS

## 2019-03-14 ENCOUNTER — Telehealth: Payer: Self-pay | Admitting: Nurse Practitioner

## 2019-03-14 NOTE — Telephone Encounter (Signed)
I think Dr.Lavoie meant to sent this to you.

## 2019-03-14 NOTE — Telephone Encounter (Signed)
-----   Message from Princess Bruins, MD sent at 03/14/2019 12:13 PM EST ----- Ok schedule patient for a Preop visit with me. ----- Message ----- From: Flossie Buffy, NP Sent: 03/14/2019  11:21 AM EST To: Princess Bruins, MD, #  Mri confirms uterine fibroid extending to umbilicus. Normal ovaries.  I forwarded results to Dr. Dellis Filbert

## 2019-04-06 ENCOUNTER — Other Ambulatory Visit: Payer: Self-pay

## 2019-04-07 ENCOUNTER — Encounter: Payer: Self-pay | Admitting: Obstetrics & Gynecology

## 2019-04-07 ENCOUNTER — Ambulatory Visit (INDEPENDENT_AMBULATORY_CARE_PROVIDER_SITE_OTHER): Payer: BC Managed Care – PPO | Admitting: Obstetrics & Gynecology

## 2019-04-07 VITALS — BP 134/84

## 2019-04-07 DIAGNOSIS — Z712 Person consulting for explanation of examination or test findings: Secondary | ICD-10-CM

## 2019-04-07 DIAGNOSIS — D219 Benign neoplasm of connective and other soft tissue, unspecified: Secondary | ICD-10-CM | POA: Diagnosis not present

## 2019-04-07 NOTE — Progress Notes (Signed)
Linda Chaney May 05, 1970 [id number]        49 y.o.  G0P0000 Stable boyfriend x 3 yrs   RP: Large Uterine mass for management with MRI results  HPI: Patient had no abdominopelvic pain, just had an episode of metrorrhagia, menses otherwise with normal flow every month.  A Pelvic US was done for irregular menses 02/07/19 showing a very large abdominopelvic mass attached to the uterus by a peduncle, measured at 19.6 x 10.1 x 13.7 cm.  No pain with IC.  Urine/BMs normal.  No change in weight, but difficulty loosing weight and fills that her belly is large. MRI of the pelvis done 03/13/2019.   OB History  Gravida Para Term Preterm AB Living  0 0 0 0 0 0  SAB TAB Ectopic Multiple Live Births  0 0 0 0 0    Past medical history,surgical history, problem list, medications, allergies, family history and social history were all reviewed and documented in the EPIC chart.   Directed ROS with pertinent positives and negatives documented in the history of present illness/assessment and plan.  Exam:  Vitals:   04/07/19 1106  BP: 134/84   General appearance:  Normal  Abdomen: Nodular enlarged Uterus/Fibroid well above the umbilicus.  Mobile.  NT.  Abdomen soft otherwise and not distended.  Gynecologic exam: Deferred.  Pelvic US 02/07/2019: Uterus  Measurements: 7.6 x 3.5 x 5.1 cm = volume: 70 mL. Large soft tissue mass identified anteriorly within the pelvis, adjacent to the upper uterus, question large leiomyoma. This is suboptimally visualized due to bowel gas, questionably measuring 19.6 x 10.1 x 13.7 cm. It is confirm the origin of this mass as arising from the uterus. Remainder of uterus is displaced posteriorly, retroflexed. No additional masses.  Endometrium  Thickness: 6 mm.  No endometrial fluid or focal abnormality  Right ovary  Measurements: 2.8 x 2.3 x 2.1 cm = volume: 7 mL. Small hemorrhagic cyst within RIGHT ovary 2.1 x 1.8 x 1.8 cm. No additional  RIGHT ovarian masses.  Left ovary  Measurements: 2.5 x 1.1 x 1.7 cm = volume: 3 mL. Normal morphology without mass  Other findings  Small amount of free pelvic fluid.  No other adnexal masses.  IMPRESSION: Small hemorrhagic cyst within RIGHT ovary 2.1 cm diameter.  Large soft tissue mass anteriorly within the pelvis, adjacent to the anterior uterine wall, question 19.6 x 10.1 x 13.7 cm.  Burtis Junes this representing a large exophytic uterine leiomyoma though it is difficult to confirm origin from uterus; further characterization of this lesion by MR imaging with and without contrast recommended to exclude extra uterine masses and leiomyosarcoma.  MRI 03/13/2019: Urinary Tract:  Ureters and bladder normal.  Bowel: Limited view the small bowel and recto sigmoid colon is normal  Vascular/Lymphatic: No pelvic lymphadenopathy.  Reproductive: Large intramural mass expands the uterine fundus. This mass measures 13.1 x 9.2 x 14.0 cm (volume = 880 cm^3).  The mass is heterogeneous low signal intensity on T2 weighted imaging and demonstrates uniform post post-contrast enhancement consistent with benign leiomyoma.  Overall size of the uterus measures 19.2 x 9.6 by 14.0 cm (volume = 1400 cm^3). Including the leiomyoma.  The endometrium is normal thickness in for premenopausal female at 9 mm.  The junctional zone is within normal limits. The uterus is retroverted.  Ovaries are small. RIGHT ovary measures 2.6 by 1.3 cm. LEFT ovary measures 1.7 by 1.4 cm. No free fluid the pelvis.  No vascular anomaly.  Musculoskeletal: No  aggressive osseous lesion  Hemoglobin on February 01, 2019 was 13.4   Assessment/Plan:  49 y.o. G0  1. Large Fibroid New diagnosis of a very large uterine fibroid on pelvic ultrasound February 07, 2019.  The very large size of the uterus with a fibroid is causing abdominal pelvic discomfort and pain.  No secondary anemia.  MRI done March 13, 2019 confirms a large uterine fibroid with no features suggestive of leiomyosarcoma.  The endometrium appears normal.  Both ovaries are normal in size and appearance.  Counseling on management done thoroughly.  Given the very large size of the uterine fibroid, medical approaches only not recommended.  All surgical approaches reviewed.  Decision to proceed with a total abdominal hysterectomy with bilateral salpingectomy.  Will preserve the ovaries.  If the surgery is delayed given the Covid pandemic, will proceed with one 3 month dose of Depo-Lupron.  Surgery and risks reviewed with patient.  Patient agrees with plan.  Will follow up with a preop visit.  Counseling on above issues and coordination of care more than 50% for 25 minutes.  Princess Bruins MD, 11:54 AM 04/07/2019

## 2019-04-09 ENCOUNTER — Encounter: Payer: Self-pay | Admitting: Obstetrics & Gynecology

## 2019-04-09 NOTE — Patient Instructions (Addendum)
1. Large Fibroid New diagnosis of a very large uterine fibroid on pelvic ultrasound February 07, 2019.  The very large size of the uterus with a fibroid is causing abdominal pelvic discomfort and pain.  No secondary anemia.  MRI done March 13, 2019 confirms a large uterine fibroid with no features suggestive of leiomyosarcoma.  The endometrium appears normal.  Both ovaries are normal in size and appearance.  Counseling on management done thoroughly.  Given the very large size of the uterine fibroid, medical approaches only not recommended.  All surgical approaches reviewed.  Decision to proceed with a total abdominal hysterectomy with bilateral salpingectomy.  Will preserve the ovaries.  If the surgery is delayed given the Covid pandemic, will proceed with one 3 month dose of Depo-Lupron.  Surgery and risks reviewed with patient.  Patient agrees with plan.  Will follow up with a preop visit.   Linda Chaney, it was a pleasure seeing you today!

## 2019-05-16 ENCOUNTER — Telehealth: Payer: Self-pay

## 2019-05-16 NOTE — Telephone Encounter (Signed)
I called patient to follow up with her about scheduling TAH, BSE.  She had been going to do Lupron but could not afford the copayment as it was very expensive.  Dr. Marguerita Merles recommended she could proceed directly to surgery. I wanted her to know I had the April schedule and available dates.  She did want to schedule.  We discussed her ins benefits and her estimated surgery prepymt due to [place] by one week prior to surgery.  I will mail financial letter to her. I scheduled her for Covid screen (no Covid in last 90 days) and reviewed quarantine protocol after testing. Rosemarie Ax will call her back to schedule a pre op appt with Dr. Marguerita Merles.  Packet will be mailed to patient.

## 2019-05-24 ENCOUNTER — Telehealth: Payer: Self-pay

## 2019-05-24 NOTE — Telephone Encounter (Signed)
Patient called in voice mail stating she has a question about her upcoming surgery.  I returned her call but her voice mail box is full and I could not leave a message.

## 2019-05-25 NOTE — Telephone Encounter (Signed)
Patient called. She needs to r/s her surgery due to work related commitments.  Surgery r/s from 4/12  To 4/21.  New packet will be sent with new dates.  I will call her tomorrow to r/s Covid screen. Pre op appt should be fine.

## 2019-05-26 NOTE — Telephone Encounter (Signed)
Rescheduled Covid Screen test and called patient and left date/time on her voice mail. New packet of info with new date sent.

## 2019-06-05 ENCOUNTER — Encounter: Payer: Self-pay | Admitting: Anesthesiology

## 2019-06-06 ENCOUNTER — Telehealth: Payer: Self-pay

## 2019-06-06 NOTE — Telephone Encounter (Signed)
Patient called in voice mail stating she had a question about surgery. I called her back but her voice mail box is full and I cannot leave a message.

## 2019-07-03 ENCOUNTER — Other Ambulatory Visit: Payer: Self-pay

## 2019-07-04 ENCOUNTER — Ambulatory Visit (INDEPENDENT_AMBULATORY_CARE_PROVIDER_SITE_OTHER): Payer: BC Managed Care – PPO | Admitting: Obstetrics & Gynecology

## 2019-07-04 ENCOUNTER — Encounter: Payer: Self-pay | Admitting: Obstetrics & Gynecology

## 2019-07-04 VITALS — BP 118/72 | Ht 66.5 in | Wt 219.0 lb

## 2019-07-04 DIAGNOSIS — D219 Benign neoplasm of connective and other soft tissue, unspecified: Secondary | ICD-10-CM

## 2019-07-04 NOTE — Progress Notes (Signed)
Linda Chaney [birth year] [id number]   History:    49 y.o. G0 Boyfriend x 3 yrs  RP:  Preop TAH/Bilateral Salpingectomy for a very large uterine myoma  HPI:No change x last visit 04/07/2019.  LMP wnl on 05/24/2019.  Patient had no abdominopelvic pain, just had an episode of metrorrhagia, menses otherwise with normal flow every month. A Pelvic US was done for irregular menses 02/07/19 showing a very large abdominopelvic mass attached to the uterus by a peduncle, measured at 19.6 x 10.1 x 13.7 cm. No pain with IC. Urine/BMs normal. No change in weight, but difficulty loosing weight and fills that her belly is large. MRI of the pelvis done 03/13/2019.  Past medical history,surgical history, family history and social history were all reviewed and documented in the EPIC chart.  Gynecologic History Patient's last menstrual period was 05/24/2019.  Obstetric History OB History  Gravida Para Term Preterm AB Living  0 0 0 0 0 0  SAB TAB Ectopic Multiple Live Births  0 0 0 0 0     ROS: A ROS was performed and pertinent positives and negatives are included in the history.  GENERAL: No fevers or chills. HEENT: No change in vision, no earache, sore throat or sinus congestion. NECK: No pain or stiffness. CARDIOVASCULAR: No chest pain or pressure. No palpitations. PULMONARY: No shortness of breath, cough or wheeze. GASTROINTESTINAL: No abdominal pain, nausea, vomiting or diarrhea, melena or bright red blood per rectum. GENITOURINARY: No urinary frequency, urgency, hesitancy or dysuria. MUSCULOSKELETAL: No joint or muscle pain, no back pain, no recent trauma. DERMATOLOGIC: No rash, no itching, no lesions. ENDOCRINE: No polyuria, polydipsia, no heat or cold intolerance. No recent change in weight. HEMATOLOGICAL: No anemia or easy bruising or bleeding. NEUROLOGIC: No headache, seizures, numbness, tingling or weakness. PSYCHIATRIC: No depression, no loss of interest in normal activity or change in sleep  pattern.     Exam:   BP 118/72   Ht 5' 6.5" (1.689 m)   Wt 219 lb (99.3 kg)   LMP 05/24/2019   BMI 34.82 kg/m   Body mass index is 34.82 kg/m.  General appearance : Well developed well nourished female. No acute distress  Pelvic: Deferred  MRI of Pelvis 03/13/2019:    Urinary Tract:  Ureters and bladder normal. Bowel: Limited view the small bowel and recto sigmoid colon is normal. Vascular/Lymphatic: No pelvic lymphadenopathy.  Reproductive: Large intramural mass expands the uterine fundus. This mass measures 13.1 x 9.2 x 14.0 cm (volume = 880 cm^3). The mass is heterogeneous low signal intensity and demonstrates uniform post-contrast enhancement consistent with benign leiomyoma. Overall size of the uterus measures 19.2 x 9.6 by 14.0 cm (volume = 1400 cm^3). Including the leiomyoma. The endometrium is normal thickness in for premenopausal female at 9 mm. The junctional zone is within normal limits. The uterus is retroverted. Ovaries are small. RIGHT ovary measures 2.6 by 1.3 cm. LEFT ovary measures 1.7 by 1.4 cm.  No free fluid the pelvis.  No vascular anomaly.   Assessment/Plan:  49 y.o. female for annual exam   1. Large Fibroid Very large symptomatic uterine fibroid per physical exam and MRI findings.  On the MRI the mass corresponding to a fibroid measures 13.1 x 9.2 x 14.0 cm and the overall size of the uterus is measured at 19.2 x 9.6 x 14 cm.  The ovaries are normal on MRI.  Decision made to proceed with a TAH/Bilateral Salpingectomy.  Surgery is scheduled for 07/19/2019.  Preop preparation discussed with patient.  Surgical procedure and surgical risks thoroughly reviewed with patient.  Postop expectations and precautions explained.  Patient voiced understanding and agreement with plan.                        Patient was counseled as to the risk of surgery to include the following:  1. Infection (prohylactic antibiotics will be administered)  2. DVT/Pulmonary Embolism  (prophylactic pneumo compression stockings will be used)  3.Trauma to internal organs requiring additional surgical procedure to repair any injury to internal organs requiring perhaps additional hospitalization days.  4.Hemmorhage requiring transfusion and blood products which carry risks such as anaphylactic reaction, hepatitis and AIDS  Patient had received literature information on the procedure scheduled and all her questions were answered and fully accepts all risk.   Princess Bruins MD, 3:43 PM 07/04/2019

## 2019-07-06 ENCOUNTER — Other Ambulatory Visit (HOSPITAL_COMMUNITY): Payer: BC Managed Care – PPO

## 2019-07-10 NOTE — Patient Instructions (Addendum)
Get your Covid test at [facility] in [place] on Sat. 07/15/19 at 11:45   [place] procedure is scheduled on 07/19/19   Report to [place]  At  7:00  A.M.   Call this number if you have problems the morning of surgery:[phone]   OUR ADDRESS IS [address], WE ARE LOCATED IN THE MEDICAL PLAZA WITH [facility].   Remember:  Do not eat food or drink liquids after midnight.   Take these medicines the morning of surgery with A SIP OF WATER: none   Do not wear jewelry, make-up or nail polish.  Do not wear lotions, powders, or perfumes, or deoderant.  Do not shave 48 hours prior to surgery.  Men may shave face and neck.  Do not bring valuables to the hospital.  [facility] is not responsible for any belongings or valuables.  Contacts, dentures or bridgework may not be worn into surgery.  Leave your suitcase in the car.  After surgery it may be brought to your room.  For patients admitted to the hospital, discharge time will be determined by your treatment team.  Patients discharged the day of surgery will not be allowed to drive home.   Special instructions:  Bring  Medications in original bottles  Please read over the following fact sheets that you were given:    [facility] - Preparing for Surgery Before surgery, you can play an important role.  Because skin is not sterile, your skin needs to be as free of germs as possible.  You can reduce the number of germs on your skin by washing with CHG (chlorahexidine gluconate) soap before surgery.  CHG is an antiseptic cleaner which kills germs and bonds with the skin to continue killing germs even after washing. Please DO NOT use if you have an allergy to CHG or antibacterial soaps.  If your skin becomes reddened/irritated stop using the CHG and inform your nurse when you arrive at Short Stay. Do not shave (including legs and underarms) for at least 48 hours prior to the first CHG  shower.  You may shave your face/neck. Please follow these instructions carefully:  1.  Shower with CHG Soap the night before surgery and the  morning of Surgery.  2.  If you choose to wash your hair, wash your hair first as usual with your  normal  shampoo.  3.  After you shampoo, rinse your hair and body thoroughly to remove the  shampoo.                                         4.  Use CHG as you would any other liquid soap.  You can apply chg directly  to the skin and wash                       Gently with a scrungie or clean washcloth.  5.  Apply the CHG Soap to your body ONLY FROM THE NECK DOWN.   Do not use on face/ open                           Wound or open sores. Avoid contact with eyes, ears mouth and genitals (private parts).  Wash face,  Genitals (private parts) with your normal soap.             6.  Wash thoroughly, paying special attention to the area where your surgery  will be performed.  7.  Thoroughly rinse your body with warm water from the neck down.  8.  DO NOT shower/wash with your normal soap after using and rinsing off  the CHG Soap.              9.  Pat yourself dry with a clean towel.            10.  Wear clean pajamas.            11.  Place clean sheets on your bed the night of your first shower and do not  sleep with pets. Day of Surgery : Do not apply any lotions/deodorants the morning of surgery.  Please wear clean clothes to the hospital/surgery center.  FAILURE TO FOLLOW THESE INSTRUCTIONS MAY RESULT IN THE CANCELLATION OF YOUR SURGERY PATIENT SIGNATURE_________________________________  NURSE SIGNATURE__________________________________  ________________________________________________________________________

## 2019-07-11 ENCOUNTER — Other Ambulatory Visit: Payer: Self-pay

## 2019-07-11 ENCOUNTER — Encounter (HOSPITAL_COMMUNITY): Payer: Self-pay

## 2019-07-11 ENCOUNTER — Encounter (HOSPITAL_COMMUNITY)
Admission: RE | Admit: 2019-07-11 | Discharge: 2019-07-11 | Disposition: A | Discharge status: Home / Self Care | Payer: BC Managed Care – PPO | Source: Ambulatory Visit | Attending: Obstetrics & Gynecology | Admitting: Obstetrics & Gynecology

## 2019-07-11 ENCOUNTER — Encounter: Payer: Self-pay | Admitting: Gynecology

## 2019-07-11 DIAGNOSIS — Z01812 Encounter for preprocedural laboratory examination: Secondary | ICD-10-CM | POA: Insufficient documentation

## 2019-07-11 LAB — CBC
HCT: 43 % (ref 36.0–46.0)
Hemoglobin: 13.7 g/dL (ref 12.0–15.0)
MCH: 31.1 pg (ref 26.0–34.0)
MCHC: 31.9 g/dL (ref 30.0–36.0)
MCV: 97.7 fL (ref 80.0–100.0)
Platelets: 312 10*3/uL (ref 150–400)
RBC: 4.4 MIL/uL (ref 3.87–5.11)
RDW: 13.1 % (ref 11.5–15.5)
WBC: 4.6 10*3/uL (ref 4.0–10.5)
nRBC: 0 % (ref 0.0–0.2)

## 2019-07-11 LAB — ABO/RH: ABO/RH(D): B POS

## 2019-07-11 NOTE — Progress Notes (Signed)
PCP - C. Nche Cardiologist - none  Chest x-ray - no EKG - no Stress Test - no ECHO - no Cardiac Cath - no  Sleep Study - no CPAP -   Fasting Blood Sugar - NA Checks Blood Sugar _____ times a day  Blood Thinner Instructions:NA Aspirin Instructions: Last Dose:  Anesthesia review:   Patient denies shortness of breath, fever, cough and chest pain at PAT appointment yes  Patient verbalized understanding of instructions that were given to them at the PAT appointment. Patient was also instructed that they will need to review over the PAT instructions again at home before surgery. yes

## 2019-07-12 ENCOUNTER — Encounter: Payer: Self-pay | Admitting: Obstetrics & Gynecology

## 2019-07-12 NOTE — Patient Instructions (Signed)
  1. Large Fibroid Very large symptomatic uterine fibroid per physical exam and MRI findings.  On the MRI the mass corresponding to a fibroid measures 13.1 x 9.2 x 14.0 cm and the overall size of the uterus is measured at 19.2 x 9.6 x 14 cm.  The ovaries are normal on MRI.  Decision made to proceed with a TAH/Bilateral Salpingectomy.  Surgery is scheduled for 07/19/2019.  Preop preparation discussed with patient.  Surgical procedure and surgical risks thoroughly reviewed with patient.  Postop expectations and precautions explained.  Patient voiced understanding and agreement with plan.                        Patient was counseled as to the risk of surgery to include the following:  1. Infection (prohylactic antibiotics will be administered)  2. DVT/Pulmonary Embolism (prophylactic pneumo compression stockings will be used)  3.Trauma to internal organs requiring additional surgical procedure to repair any injury to internal organs requiring perhaps additional hospitalization days.  4.Hemmorhage requiring transfusion and blood products which carry risks such as anaphylactic reaction, hepatitis and AIDS  Patient had received literature information on the procedure scheduled and all her questions were answered and fully accepts all risk.  Linda Chaney, it was a pleasure seeing you today!

## 2019-07-15 ENCOUNTER — Other Ambulatory Visit (HOSPITAL_COMMUNITY)
Admission: RE | Admit: 2019-07-15 | Discharge: 2019-07-15 | Disposition: A | Discharge status: Home / Self Care | Payer: BC Managed Care – PPO | Source: Ambulatory Visit | Attending: Obstetrics & Gynecology | Admitting: Obstetrics & Gynecology

## 2019-07-15 DIAGNOSIS — Z20822 Contact with and (suspected) exposure to covid-19: Secondary | ICD-10-CM | POA: Insufficient documentation

## 2019-07-15 DIAGNOSIS — Z01812 Encounter for preprocedural laboratory examination: Secondary | ICD-10-CM | POA: Insufficient documentation

## 2019-07-15 LAB — SARS CORONAVIRUS 2 (TAT 6-24 HRS): SARS Coronavirus 2: NEGATIVE

## 2019-07-17 ENCOUNTER — Telehealth: Payer: Self-pay

## 2019-07-17 NOTE — Telephone Encounter (Signed)
Patient called to ask if I could addend her FMLA forms to okay her returning email and phone calls occasionally as needed. She said employer knows she will not be working but she has new staff who may have questions and she would like to be able to answer those as needed occasionally. I addended the current form and emailed it to her at her request.

## 2019-07-19 ENCOUNTER — Encounter (HOSPITAL_BASED_OUTPATIENT_CLINIC_OR_DEPARTMENT_OTHER): Payer: Self-pay | Admitting: Obstetrics & Gynecology

## 2019-07-19 ENCOUNTER — Observation Stay (HOSPITAL_BASED_OUTPATIENT_CLINIC_OR_DEPARTMENT_OTHER)
Admission: RE | Admit: 2019-07-19 | Discharge: 2019-07-20 | Disposition: A | Discharge status: Home / Self Care | Payer: BC Managed Care – PPO | Attending: Obstetrics & Gynecology | Admitting: Obstetrics & Gynecology

## 2019-07-19 ENCOUNTER — Ambulatory Visit (HOSPITAL_BASED_OUTPATIENT_CLINIC_OR_DEPARTMENT_OTHER): Payer: BC Managed Care – PPO | Admitting: Registered Nurse

## 2019-07-19 ENCOUNTER — Other Ambulatory Visit: Payer: Self-pay

## 2019-07-19 ENCOUNTER — Encounter: Payer: Self-pay | Admitting: Anesthesiology

## 2019-07-19 ENCOUNTER — Encounter (HOSPITAL_BASED_OUTPATIENT_CLINIC_OR_DEPARTMENT_OTHER)
Admission: RE | Disposition: A | Discharge status: Home / Self Care | Payer: Self-pay | Source: Home / Self Care | Attending: Obstetrics & Gynecology

## 2019-07-19 DIAGNOSIS — J45909 Unspecified asthma, uncomplicated: Secondary | ICD-10-CM | POA: Insufficient documentation

## 2019-07-19 DIAGNOSIS — Z79899 Other long term (current) drug therapy: Secondary | ICD-10-CM | POA: Insufficient documentation

## 2019-07-19 DIAGNOSIS — Z801 Family history of malignant neoplasm of trachea, bronchus and lung: Secondary | ICD-10-CM | POA: Insufficient documentation

## 2019-07-19 DIAGNOSIS — N840 Polyp of corpus uteri: Secondary | ICD-10-CM

## 2019-07-19 DIAGNOSIS — Z8489 Family history of other specified conditions: Secondary | ICD-10-CM | POA: Insufficient documentation

## 2019-07-19 DIAGNOSIS — Z8249 Family history of ischemic heart disease and other diseases of the circulatory system: Secondary | ICD-10-CM | POA: Diagnosis not present

## 2019-07-19 DIAGNOSIS — D251 Intramural leiomyoma of uterus: Principal | ICD-10-CM | POA: Insufficient documentation

## 2019-07-19 DIAGNOSIS — Z9889 Other specified postprocedural states: Secondary | ICD-10-CM | POA: Diagnosis present

## 2019-07-19 DIAGNOSIS — N800 Endometriosis of uterus: Secondary | ICD-10-CM | POA: Insufficient documentation

## 2019-07-19 DIAGNOSIS — Z800 Family history of malignant neoplasm of digestive organs: Secondary | ICD-10-CM | POA: Insufficient documentation

## 2019-07-19 DIAGNOSIS — N921 Excessive and frequent menstruation with irregular cycle: Secondary | ICD-10-CM | POA: Insufficient documentation

## 2019-07-19 DIAGNOSIS — D259 Leiomyoma of uterus, unspecified: Secondary | ICD-10-CM

## 2019-07-19 DIAGNOSIS — N72 Inflammatory disease of cervix uteri: Secondary | ICD-10-CM

## 2019-07-19 HISTORY — PX: HYSTERECTOMY ABDOMINAL WITH SALPINGECTOMY: SHX6725

## 2019-07-19 LAB — TYPE AND SCREEN
ABO/RH(D): B POS
Antibody Screen: NEGATIVE

## 2019-07-19 LAB — POCT PREGNANCY, URINE: Preg Test, Ur: NEGATIVE

## 2019-07-19 SURGERY — HYSTERECTOMY, TOTAL, ABDOMINAL, WITH SALPINGECTOMY
Anesthesia: General | Site: Abdomen | Laterality: Bilateral

## 2019-07-19 MED ORDER — FENTANYL CITRATE (PF) 250 MCG/5ML IJ SOLN
INTRAMUSCULAR | Status: AC
  Filled 2019-07-19: qty 5

## 2019-07-19 MED ORDER — PROPOFOL 10 MG/ML IV BOLUS
INTRAVENOUS | Status: AC
  Filled 2019-07-19: qty 20

## 2019-07-19 MED ORDER — ACETAMINOPHEN 325 MG PO TABS: 650.0000 mg | ORAL_TABLET | ORAL | Status: DC | PRN

## 2019-07-19 MED ORDER — ESMOLOL HCL 100 MG/10ML IV SOLN
INTRAVENOUS | Status: DC | PRN
  Administered 2019-07-19: 30 mg via INTRAVENOUS

## 2019-07-19 MED ORDER — ROCURONIUM BROMIDE 10 MG/ML (PF) SYRINGE
PREFILLED_SYRINGE | INTRAVENOUS | Status: DC | PRN
  Administered 2019-07-19: 20 mg via INTRAVENOUS
  Administered 2019-07-19: 50 mg via INTRAVENOUS

## 2019-07-19 MED ORDER — HYDROMORPHONE HCL 1 MG/ML IJ SOLN
INTRAMUSCULAR | Status: AC
  Filled 2019-07-19: qty 1

## 2019-07-19 MED ORDER — ACETAMINOPHEN 500 MG PO TABS
1000.0000 mg | ORAL_TABLET | Freq: Once | ORAL | Status: AC
  Administered 2019-07-19: 1000 mg via ORAL

## 2019-07-19 MED ORDER — DEXAMETHASONE SODIUM PHOSPHATE 10 MG/ML IJ SOLN
INTRAMUSCULAR | Status: AC
  Filled 2019-07-19: qty 1

## 2019-07-19 MED ORDER — BUPIVACAINE HCL (PF) 0.25 % IJ SOLN
INTRAMUSCULAR | Status: DC | PRN
  Administered 2019-07-19: 10 mL

## 2019-07-19 MED ORDER — PROPOFOL 10 MG/ML IV BOLUS
INTRAVENOUS | Status: DC | PRN
  Administered 2019-07-19 (×2): 20 mg via INTRAVENOUS
  Administered 2019-07-19: 200 mg via INTRAVENOUS

## 2019-07-19 MED ORDER — SCOPOLAMINE 1 MG/3DAYS TD PT72
1.0000 | MEDICATED_PATCH | TRANSDERMAL | Status: DC
  Administered 2019-07-19: 1.5 mg via TRANSDERMAL

## 2019-07-19 MED ORDER — HYDROCODONE-ACETAMINOPHEN 5-325 MG PO TABS
ORAL_TABLET | ORAL | Status: AC
  Filled 2019-07-19: qty 1

## 2019-07-19 MED ORDER — FENTANYL CITRATE (PF) 100 MCG/2ML IJ SOLN
INTRAMUSCULAR | Status: AC
  Filled 2019-07-19: qty 2

## 2019-07-19 MED ORDER — OXYCODONE HCL 5 MG/5ML PO SOLN
5.0000 mg | Freq: Once | ORAL | Status: DC | PRN
  Filled 2019-07-19: qty 5

## 2019-07-19 MED ORDER — ROCURONIUM BROMIDE 10 MG/ML (PF) SYRINGE
PREFILLED_SYRINGE | INTRAVENOUS | Status: AC
  Filled 2019-07-19: qty 10

## 2019-07-19 MED ORDER — KETOROLAC TROMETHAMINE 30 MG/ML IJ SOLN
INTRAMUSCULAR | Status: AC
  Filled 2019-07-19: qty 1

## 2019-07-19 MED ORDER — KETOROLAC TROMETHAMINE 30 MG/ML IJ SOLN
30.0000 mg | Freq: Once | INTRAMUSCULAR | Status: AC | PRN
  Administered 2019-07-19: 30 mg via INTRAVENOUS

## 2019-07-19 MED ORDER — ACETAMINOPHEN 500 MG PO TABS
ORAL_TABLET | ORAL | Status: AC
  Filled 2019-07-19: qty 2

## 2019-07-19 MED ORDER — FENTANYL CITRATE (PF) 250 MCG/5ML IJ SOLN
INTRAMUSCULAR | Status: DC | PRN
  Administered 2019-07-19 (×2): 50 ug via INTRAVENOUS
  Administered 2019-07-19: 100 ug via INTRAVENOUS
  Administered 2019-07-19: 50 ug via INTRAVENOUS
  Administered 2019-07-19: 100 ug via INTRAVENOUS

## 2019-07-19 MED ORDER — MIDAZOLAM HCL 2 MG/2ML IJ SOLN
INTRAMUSCULAR | Status: AC
  Filled 2019-07-19: qty 2

## 2019-07-19 MED ORDER — DEXAMETHASONE SODIUM PHOSPHATE 10 MG/ML IJ SOLN
INTRAMUSCULAR | Status: DC | PRN
  Administered 2019-07-19: 10 mg via INTRAVENOUS

## 2019-07-19 MED ORDER — ONDANSETRON HCL 4 MG/2ML IJ SOLN
INTRAMUSCULAR | Status: AC
  Filled 2019-07-19: qty 2

## 2019-07-19 MED ORDER — PROMETHAZINE HCL 25 MG/ML IJ SOLN: 6.2500 mg | INTRAMUSCULAR | Status: DC | PRN

## 2019-07-19 MED ORDER — LACTATED RINGERS IV SOLN: INTRAVENOUS | Status: DC

## 2019-07-19 MED ORDER — HYDROMORPHONE HCL 1 MG/ML IJ SOLN: 0.5000 mg | INTRAMUSCULAR | Status: DC | PRN

## 2019-07-19 MED ORDER — FLUOCIN-HYDROQUINONE-TRETINOIN 0.01-4-0.05 % EX CREA
1.0000 "application " | TOPICAL_CREAM | CUTANEOUS | Status: DC
  Filled 2019-07-19: qty 30

## 2019-07-19 MED ORDER — VASOPRESSIN 20 UNIT/ML IV SOLN
INTRAVENOUS | Status: DC | PRN
  Administered 2019-07-19: 8 mL via INTRAMUSCULAR

## 2019-07-19 MED ORDER — ESMOLOL HCL 100 MG/10ML IV SOLN
INTRAVENOUS | Status: AC
  Filled 2019-07-19: qty 10

## 2019-07-19 MED ORDER — HYDROCODONE-ACETAMINOPHEN 5-325 MG PO TABS
1.0000 | ORAL_TABLET | ORAL | Status: DC | PRN
  Administered 2019-07-19: 2 via ORAL
  Administered 2019-07-19 (×2): 1 via ORAL
  Administered 2019-07-19 – 2019-07-20 (×3): 2 via ORAL

## 2019-07-19 MED ORDER — OXYCODONE HCL 5 MG PO TABS: 5.0000 mg | ORAL_TABLET | Freq: Once | ORAL | Status: DC | PRN

## 2019-07-19 MED ORDER — MIDAZOLAM HCL 5 MG/5ML IJ SOLN
INTRAMUSCULAR | Status: DC | PRN
  Administered 2019-07-19: 2 mg via INTRAVENOUS

## 2019-07-19 MED ORDER — LIDOCAINE 2% (20 MG/ML) 5 ML SYRINGE
INTRAMUSCULAR | Status: AC
  Filled 2019-07-19: qty 5

## 2019-07-19 MED ORDER — HYDROCODONE-ACETAMINOPHEN 5-325 MG PO TABS
ORAL_TABLET | ORAL | Status: AC
  Filled 2019-07-19: qty 2

## 2019-07-19 MED ORDER — CEFAZOLIN SODIUM-DEXTROSE 2-4 GM/100ML-% IV SOLN
2.0000 g | INTRAVENOUS | Status: AC
  Administered 2019-07-19: 2 g via INTRAVENOUS

## 2019-07-19 MED ORDER — MEPERIDINE HCL 25 MG/ML IJ SOLN: 6.2500 mg | INTRAMUSCULAR | Status: DC | PRN

## 2019-07-19 MED ORDER — ONDANSETRON HCL 4 MG/2ML IJ SOLN
INTRAMUSCULAR | Status: DC | PRN
  Administered 2019-07-19: 4 mg via INTRAVENOUS

## 2019-07-19 MED ORDER — SODIUM CHLORIDE 0.9 % IV SOLN
INTRAVENOUS | Status: DC | PRN
  Administered 2019-07-19: 30 mL

## 2019-07-19 MED ORDER — HYDROMORPHONE HCL 1 MG/ML IJ SOLN
0.2500 mg | INTRAMUSCULAR | Status: DC | PRN
  Administered 2019-07-19: 0.25 mg via INTRAVENOUS
  Administered 2019-07-19: 0.5 mg via INTRAVENOUS
  Administered 2019-07-19 (×2): 0.25 mg via INTRAVENOUS
  Administered 2019-07-19: 0.5 mg via INTRAVENOUS

## 2019-07-19 MED ORDER — HYDROMORPHONE HCL 2 MG PO TABS: 1.0000 mg | ORAL_TABLET | ORAL | Status: DC | PRN

## 2019-07-19 MED ORDER — LIDOCAINE 2% (20 MG/ML) 5 ML SYRINGE
INTRAMUSCULAR | Status: DC | PRN
  Administered 2019-07-19: 60 mg via INTRAVENOUS

## 2019-07-19 MED ORDER — CLINDAMYCIN PHOSPHATE 1 % EX SOLN
1.0000 "application " | Freq: Two times a day (BID) | CUTANEOUS | Status: DC
  Filled 2019-07-19: qty 30

## 2019-07-19 MED ORDER — SUGAMMADEX SODIUM 200 MG/2ML IV SOLN
INTRAVENOUS | Status: DC | PRN
  Administered 2019-07-19: 200 mg via INTRAVENOUS

## 2019-07-19 MED ORDER — SCOPOLAMINE 1 MG/3DAYS TD PT72
MEDICATED_PATCH | TRANSDERMAL | Status: AC
  Filled 2019-07-19: qty 1

## 2019-07-19 MED ORDER — CEFAZOLIN SODIUM-DEXTROSE 2-4 GM/100ML-% IV SOLN
INTRAVENOUS | Status: AC
  Filled 2019-07-19: qty 100

## 2019-07-19 SURGICAL SUPPLY — 37 items
CANISTER SUCT 3000ML PPV (MISCELLANEOUS) ×3 IMPLANT
DECANTER SPIKE VIAL GLASS SM (MISCELLANEOUS) IMPLANT
DERMABOND ADVANCED (GAUZE/BANDAGES/DRESSINGS) ×2
DERMABOND ADVANCED .7 DNX12 (GAUZE/BANDAGES/DRESSINGS) ×1 IMPLANT
DRAPE WARM FLUID 44X44 (DRAPES) IMPLANT
DRSG OPSITE POSTOP 4X10 (GAUZE/BANDAGES/DRESSINGS) ×3 IMPLANT
DURAPREP 26ML APPLICATOR (WOUND CARE) ×3 IMPLANT
GAUZE 4X4 16PLY RFD (DISPOSABLE) ×3 IMPLANT
GLOVE BIO SURGEON STRL SZ 6.5 (GLOVE) ×2 IMPLANT
GLOVE BIO SURGEON STRL SZ7.5 (GLOVE) ×3 IMPLANT
GLOVE BIO SURGEONS STRL SZ 6.5 (GLOVE) ×1
GLOVE BIOGEL PI IND STRL 7.0 (GLOVE) ×3 IMPLANT
GLOVE BIOGEL PI INDICATOR 7.0 (GLOVE) ×6
GOWN STRL REUS W/TWL LRG LVL3 (GOWN DISPOSABLE) ×9 IMPLANT
HEMOSTAT ARISTA ABSORB 3G PWDR (HEMOSTASIS) ×3 IMPLANT
HIBICLENS CHG 4% 4OZ (MISCELLANEOUS) ×3 IMPLANT
LIGASURE IMPACT 36 18CM CVD LR (INSTRUMENTS) ×3 IMPLANT
NEEDLE HYPO 22GX1.5 SAFETY (NEEDLE) ×6 IMPLANT
PACK ABDOMINAL (CUSTOM PROCEDURE TRAY) ×3 IMPLANT
PAD OB MATERNITY 4.3X12.25 (PERSONAL CARE ITEMS) ×3 IMPLANT
PROTECTOR NERVE ULNAR (MISCELLANEOUS) ×3 IMPLANT
SPONGE LAP 18X18 RF (DISPOSABLE) ×6 IMPLANT
SUT MNCRL AB 4-0 PS2 18 (SUTURE) ×3 IMPLANT
SUT PLAIN 2 0 XLH (SUTURE) ×3 IMPLANT
SUT PLAIN 3 0 CT 1 27 (SUTURE) IMPLANT
SUT VIC AB 0 CT1 18XCR BRD 8 (SUTURE) ×1 IMPLANT
SUT VIC AB 0 CT1 27 (SUTURE) ×12
SUT VIC AB 0 CT1 27XBRD ANBCTR (SUTURE) ×4 IMPLANT
SUT VIC AB 0 CT1 27XCR 8 STRN (SUTURE) ×2 IMPLANT
SUT VIC AB 0 CT1 8-18 (SUTURE) ×2
SUT VIC AB 3-0 SH 27 (SUTURE)
SUT VIC AB 3-0 SH 27X BRD (SUTURE) IMPLANT
SUT VIC AB 4-0 PS2 18 (SUTURE) ×3 IMPLANT
SUT VICRYL 0 TIES 12 18 (SUTURE) ×3 IMPLANT
SYR CONTROL 10ML LL (SYRINGE) ×6 IMPLANT
TOWEL OR 17X26 10 PK STRL BLUE (TOWEL DISPOSABLE) ×3 IMPLANT
TRAY FOLEY W/BAG SLVR 14FR (SET/KITS/TRAYS/PACK) ×3 IMPLANT

## 2019-07-19 NOTE — Anesthesia Postprocedure Evaluation (Signed)
Anesthesia Post Note  Patient: Editor, commissioning  Procedure(s) Performed: HYSTERECTOMY ABDOMINAL WITH SALPINGECTOMY (Bilateral Abdomen)     Patient location during evaluation: PACU Anesthesia Type: General Level of consciousness: awake and alert, oriented and patient cooperative Pain management: pain level controlled Vital Signs Assessment: post-procedure vital signs reviewed and stable Respiratory status: spontaneous breathing, nonlabored ventilation and respiratory function stable Cardiovascular status: blood pressure returned to baseline and stable Postop Assessment: no apparent nausea or vomiting Anesthetic complications: no    Last Vitals:  Vitals:   07/19/19 0744 07/19/19 1130  BP: 120/87 129/80  Pulse: (!) 105 90  Resp: 18 12  Temp: 36.8 C (!) 36.3 C  SpO2: 98% 100%    Last Pain:  Vitals:                 Pervis Hocking

## 2019-07-19 NOTE — Anesthesia Preprocedure Evaluation (Signed)
Anesthesia Evaluation  Patient identified by MRN, date of birth, ID band Patient awake    Reviewed: Allergy & Precautions, NPO status , Patient's Chart, lab work & pertinent test results  Airway Mallampati: II  TM Distance: >3 FB Neck ROM: Full    Dental no notable dental hx. (+) Teeth Intact, Dental Advisory Given   Pulmonary asthma ,    Pulmonary exam normal breath sounds clear to auscultation       Cardiovascular negative cardio ROS Normal cardiovascular exam Rhythm:Regular Rate:Normal     Neuro/Psych negative neurological ROS  negative psych ROS   GI/Hepatic negative GI ROS, Neg liver ROS,   Endo/Other  negative endocrine ROS  Renal/GU negative Renal ROS  Female GU complaint Large uterine fibroid     Musculoskeletal negative musculoskeletal ROS (+)   Abdominal   Peds negative pediatric ROS (+)  Hematology hct 43   Anesthesia Other Findings   Reproductive/Obstetrics negative OB ROS                             Anesthesia Physical Anesthesia Plan  ASA: II  Anesthesia Plan: General   Post-op Pain Management:    Induction: Intravenous  PONV Risk Score and Plan: 4 or greater and Ondansetron, Dexamethasone, Midazolam, Scopolamine patch - Pre-op and Diphenhydramine  Airway Management Planned: Oral ETT  Additional Equipment: None  Intra-op Plan:   Post-operative Plan: Extubation in OR  Informed Consent: I have reviewed the patients History and Physical, chart, labs and discussed the procedure including the risks, benefits and alternatives for the proposed anesthesia with the patient or authorized representative who has indicated his/her understanding and acceptance.     Dental advisory given  Plan Discussed with: CRNA  Anesthesia Plan Comments:         Anesthesia Quick Evaluation

## 2019-07-19 NOTE — Transfer of Care (Signed)
   Last Vitals:  Vitals Value Taken Time  BP 129/80 07/19/19 1130  Temp 36.3 C 07/19/19 1130  Pulse 88 07/19/19 1133  Resp 13 07/19/19 1133  SpO2 100 % 07/19/19 1133  Vitals shown include unvalidated device data.  Last Pain: There were no vitals filed for this visit.      Immediate Anesthesia Transfer of Care Note  Patient: Linda Chaney  Procedure(s) Performed: Procedure(s) (LRB): HYSTERECTOMY ABDOMINAL WITH SALPINGECTOMY (Bilateral)  Patient Location: PACU  Anesthesia Type: General  Level of Consciousness: awake, alert  and oriented  Airway & Oxygen Therapy: Patient Spontanous Breathing and Patient connected to face mask oxygen  Post-op Assessment: Report given to PACU RN and Post -op Vital signs reviewed and stable  Post vital signs: Reviewed and stable  Complications: No apparent anesthesia complications

## 2019-07-19 NOTE — Op Note (Signed)
Operative Note  07/19/2019  11:17 AM  PATIENT:  Linda Chaney  49 y.o. female  PRE-OPERATIVE DIAGNOSIS:  Large uterine fibroid  POST-OPERATIVE DIAGNOSIS:  Large uterine fibroid  PROCEDURE:  Procedure(s): TOTAL ABDOMINAL HYSTERECTOMY WITH BILATERAL SALPINGECTOMY  SURGEON:  Surgeon(s): Princess Bruins, MD Joseph Pierini, MD  ANESTHESIA:   general  FINDINGS: Large Fundal/Anterior Fibroid, normal Tubes and Ovaries  DESCRIPTION OF OPERATION: Under general anesthesia with endotracheal intubation the patient is in dorsal decubitus position.  She is prepped with DuraPrep on the abdomen and with Betadine on the suprapubic and vulvar areas.  The Foley is put in place in the bladder.  The patient is draped as usual.  Timeout is done.  The uterus with the fibroid goes well above the umbilicus on the right aspect of the abdomen.  We marked the skin with a surgical pen for a Pfannenstiel incision.  Infiltration of Marcaine one quarter plain at the incision marking.  We make a Pfannenstiel incision with the scalpel.  The adipose tissue is open with the scalpel and then with the electrocautery.  We opened the aponeurosis transversely with the electrocautery and extended on each side with the Mayo scissors.  The recti muscles are separated from the aponeurosis on the midline superiorly and inferiorly.  The parietal peritoneum is opened longitudinally with Metzenbaums scissors.  The very large anterior fundal fibroid is exteriorized at the skin.  Abdominal exploration including the appendix are normal.  The large fibroid is subserosal with a wide base.  The uterus is otherwise small with normal tubes and ovaries.  The decision is therefore made to proceed with a myomectomy first to ease the hysterectomy and improve vision.  We therefore infiltrate vasopressin at the site of the myomectomy.  We then used the electro cautery to make an incision above the base of the myoma and proceed with excision.  We  carefully brought the bladder down away from the myoma.  The myoma is easily and safely excised and removed of the abdomen to be sent to pathology.  The Balfour retractor is put in place with the bladder blade and 2 laps to retract the bowels. We used Kelly clamps at the right and left cornua of the uterus which helped controlling hemostasis at the base of the myomectomy.  Both ureters are in normal anatomic position with good peristalsis.  We start on the right side using the LigaSure we cauterized and sectioned the right mesosalpinx.  We then cauterized and section the right round ligament.  We cauterized and section the right utero-ovarian ligament.  The right uterine artery is skeletonized.  We proceeded exactly the same way on the left side.  The bladder is distended past the cervix anteriorly.  The right uterine artery is cauterized and section using LigaSure.  We cauterized and section the left uterine artery with LigaSure.  We then clamp the right uterosacral ligament with a curved Heaney we section with Mayo scissors and keep the right uterosacral ligament on a hemostat.  We proceeded the same way on the left.  The cervix is long.  We cauterized and sectioned on either side with the LigaSure.  When we arrive at the vaginal angle we used the curved Heaney on each side we clamp, section with Mayo scissors and suture with a Heaney stitch of Vicryl 0 on each side.  Those angles are kept on a curved hemostat.  We then closed the vagina with figures of 8 of Vicryl 0.  We attached the uterosacral  ligament to the angle of the vagina on the respective side.  We irrigate and suction the pelvic cavity.  Hemostasis is adequate at all pedicles and at the vaginal vault.  We use Arista for hemostasis.  The 2 laps are removed.  The Balfour retractor is removed.  We complete hemostasis on the recti abdominus muscles with the Bovie.  We closed the aponeurosis with 2 half running sutures of Vicryl 0.  The adipose tissue is  reapproximated with a plain 2/0. Experel in infiltrated at the skin. The skin is closed with a subcuticular suture of Monocryl 4-0.  Dermabond is added.  Hemostasis is adequate at the skin.  The patient is brought to recovery room in good and stable status.  ESTIMATED BLOOD LOSS: 200 mL   Intake/Output Summary (Last 24 hours) at 07/19/2019 1117 Last data filed at 07/19/2019 1051 Gross per 24 hour  Intake 2100 ml  Output 300 ml  Net 1800 ml     BLOOD ADMINISTERED:none   LOCAL MEDICATIONS USED:  Experel  SPECIMEN:  Source of Specimen:  Uterus with cervix, bilateral tubes and large fibroid  DISPOSITION OF SPECIMEN:  PATHOLOGY  COUNTS:  YES  PLAN OF CARE: Transfer to PACU  Marie-Lyne LavoieMD11:17 AM

## 2019-07-19 NOTE — Anesthesia Procedure Notes (Signed)
Procedure Name: Intubation Date/Time: 07/19/2019 9:04 AM Performed by: Talbot Grumbling, CRNA Pre-anesthesia Checklist: Patient identified, Emergency Drugs available, Suction available and Patient being monitored Patient Re-evaluated:Patient Re-evaluated prior to induction Oxygen Delivery Method: Circle system utilized Preoxygenation: Pre-oxygenation with 100% oxygen Induction Type: IV induction Ventilation: Mask ventilation without difficulty Laryngoscope Size: Mac and 3 Grade View: Grade I Tube type: Oral Tube size: 7.0 mm Number of attempts: 1 Airway Equipment and Method: Stylet Placement Confirmation: ETT inserted through vocal cords under direct vision,  positive ETCO2 and breath sounds checked- equal and bilateral Secured at: 21 cm Tube secured with: Tape Dental Injury: Teeth and Oropharynx as per pre-operative assessment

## 2019-07-19 NOTE — H&P (Signed)
Linda Chaney is an 49 y.o. female. G0 Boyfriend x 3 yrs  RP: TAH/Bilateral Salpingectomy for a very large uterine myoma  HPI:No change x last visit 07/04/2019.  LMP wnl on 05/24/2019.  Patient had no abdominopelvic pain, just had an episode of metrorrhagia, menses otherwise with normal to heavy flow every 1-2 months. A Pelvic US was done for irregular menses 02/07/19 showing a very large abdominopelvic mass attached to the uterus by a peduncle, measured at 19.6 x 10.1 x 13.7 cm. No pain with IC, last IC 2 weeks ago. Urine/BMs normal.No change in weight, but difficulty loosing weight and fills that her belly is large.MRI of the pelvisdone12/14/2020.  Pertinent Gynecological History: Menses: Menometrorrhagia Contraception: none Blood transfusions: none Sexually transmitted diseases: no past history Last mammogram: normal  Last pap: normal  OB History: G0   Menstrual History: 05/24/2019    Past Medical History:  Diagnosis Date  . Asthma     Past Surgical History:  Procedure Laterality Date  . CYST REMOVAL TRUNK    . MOUTH SURGERY      Family History  Problem Relation Age of Onset  . Hypertension Mother   . Other Mother        [name]  . Hypertension Father   . Cancer Paternal Grandfather        lung cancer due to tobacco use  . Cancer Maternal Grandmother        colon-    Social History:  reports that she has never smoked. She has never used smokeless tobacco. She reports current alcohol use. She reports that she does not use drugs.  Allergies: No Known Allergies  Medications Prior to Admission  Medication Sig Dispense Refill Last Dose  . clindamycin (CLEOCIN T) 1 % external solution Apply 1 application topically 2 (two) times daily.    07/18/2019 at Unknown time  . Fluocin-Hydroquinone-Tretinoin 0.01-4-0.05 % CREA Apply 1 application topically 2 (two) times a week.    Past Week at Unknown time    REVIEW OF SYSTEMS: A ROS was performed and pertinent  positives and negatives are included in the history.  GENERAL: No fevers or chills. HEENT: No change in vision, no earache, sore throat or sinus congestion. NECK: No pain or stiffness. CARDIOVASCULAR: No chest pain or pressure. No palpitations. PULMONARY: No shortness of breath, cough or wheeze. GASTROINTESTINAL: No abdominal pain, nausea, vomiting or diarrhea, melena or bright red blood per rectum. GENITOURINARY: No urinary frequency, urgency, hesitancy or dysuria. MUSCULOSKELETAL: No joint or muscle pain, no back pain, no recent trauma. DERMATOLOGIC: No rash, no itching, no lesions. ENDOCRINE: No polyuria, polydipsia, no heat or cold intolerance. No recent change in weight. HEMATOLOGICAL: No anemia or easy bruising or bleeding. NEUROLOGIC: No headache, seizures, numbness, tingling or weakness. PSYCHIATRIC: No depression, no loss of interest in normal activity or change in sleep pattern.     Blood pressure 120/87, pulse (!) 105, temperature 98.2 F (36.8 C), resp. rate 18, height 5\' 6"  (1.676 m), weight 99.7 kg, SpO2 98 %.  Physical Exam:  See office notes   Results for orders placed or performed during the hospital encounter of 07/19/19 (from the past 24 hour(s))  Pregnancy, urine POC     Status: None   Collection Time: 07/19/19  7:12 AM  Result Value Ref Range   Preg Test, Ur NEGATIVE NEGATIVE   Covid Negative  Hb 13.7 on 07/11/2019   MRI of Pelvis 03/13/2019:    Urinary Tract: Ureters and bladder normal. Bowel:  Limited view the small bowel and recto sigmoid colon is normal. Vascular/Lymphatic: No pelvic lymphadenopathy.  Reproductive: Large intramural mass expands the uterine fundus. This mass measures 13.1 x 9.2 x 14.0 cm (volume = 880 cm^3). The mass is heterogeneous low signal intensity and demonstrates uniform post-contrast enhancement consistent with benign leiomyoma. Overall size of the uterus measures 19.2 x 9.6 by 14.0 cm (volume = 1400 cm^3). Including the leiomyoma. The  endometrium is normal thickness in for premenopausal female at 9 mm. The junctional zone is within normal limits. The uterus is retroverted. Ovaries are small. RIGHT ovary measures 2.6 by 1.3 cm. LEFT ovary measures 1.7 by 1.4 cm.  No free fluid the pelvis. No vascular anomaly.   Assessment/Plan:  49 y.o. female for annual exam   1. Large Fibroid Very large symptomatic uterine fibroid per physical exam and MRI findings.  On the MRI the mass corresponding to a fibroid measures 13.1 x 9.2 x 14.0 cm and the overall size of the uterus is measured at 19.2 x 9.6 x 14 cm.  The ovaries are normal on MRI.  Decision made to proceed with a TAH/Bilateral Salpingectomy.  Surgery is scheduled for 07/19/2019.  Preop preparation discussed with patient.  Surgical procedure and surgical risks thoroughly reviewed with patient.  Postop expectations and precautions explained.  Patient voiced understanding and agreement with plan.                        Patient was counseled as to the risk of surgery to include the following:  1. Infection (prohylactic antibiotics will be administered)  2. DVT/Pulmonary Embolism (prophylactic pneumo compression stockings will be used)  3.Trauma to internal organs requiring additional surgical procedure to repair any injury to internal organs requiring perhaps additional hospitalization days.  4.Hemmorhage requiring transfusion and blood products which carry risks such as anaphylactic reaction, hepatitis and AIDS  Patient had received literature information on the procedure scheduled and all her questions were answered and fully accepts all risk.    Marie-Lyne Joshue Badal 07/19/2019, 8:39 AM

## 2019-07-20 DIAGNOSIS — D251 Intramural leiomyoma of uterus: Secondary | ICD-10-CM | POA: Diagnosis not present

## 2019-07-20 LAB — CBC
HCT: 37.8 % (ref 36.0–46.0)
Hemoglobin: 12.1 g/dL (ref 12.0–15.0)
MCH: 31.5 pg (ref 26.0–34.0)
MCHC: 32 g/dL (ref 30.0–36.0)
MCV: 98.4 fL (ref 80.0–100.0)
Platelets: 295 10*3/uL (ref 150–400)
RBC: 3.84 MIL/uL — ABNORMAL LOW (ref 3.87–5.11)
RDW: 13.4 % (ref 11.5–15.5)
WBC: 10.7 10*3/uL — ABNORMAL HIGH (ref 4.0–10.5)
nRBC: 0 % (ref 0.0–0.2)

## 2019-07-20 LAB — SURGICAL PATHOLOGY

## 2019-07-20 MED ORDER — CLINDAMYCIN PHOSPHATE 1 % EX SOLN: 1.0000 "application " | Freq: Two times a day (BID) | CUTANEOUS | 0 refills | Status: DC

## 2019-07-20 MED ORDER — HYDROCODONE-ACETAMINOPHEN 5-325 MG PO TABS
ORAL_TABLET | ORAL | Status: AC
  Filled 2019-07-20: qty 2

## 2019-07-20 MED ORDER — OXYCODONE-ACETAMINOPHEN 7.5-325 MG PO TABS: 1.0000 | ORAL_TABLET | Freq: Four times a day (QID) | ORAL | 0 refills | Status: DC | PRN

## 2019-07-20 NOTE — Discharge Instructions (Signed)
Abdominal Hysterectomy Abdominal hysterectomy is a surgical procedure to remove the womb (uterus). The uterus is the muscular organ that houses a developing baby. This surgery may be done if:  You have cancer.  You have growths (tumors or fibroids) in the uterus.  You have long-term (chronic) pain.  You are bleeding.  Your uterus has slipped down into your vagina (uterine prolapse).  You have a condition in which the tissue that lines the uterus grows outside of its normal location (endometriosis).  You have an infection in your uterus.  You are having problems with your menstrual cycle. Depending on why you are having this procedure, you may also have other reproductive organs removed. These could include:  The part of your vagina that connects with your uterus (cervix).  The organs that make eggs (ovaries).  The tubes that connect the ovaries to the uterus (fallopian tubes). Tell a health care provider about:  Any allergies you have.  All medicines you are taking, including vitamins, herbs, eye drops, creams, and over-the-counter medicines.  Any problems you or family members have had with anesthetic medicines.  Any blood disorders you have.  Any surgeries you have had.  Any medical conditions you have.  Whether you are pregnant or may be pregnant. What are the risks? Generally, this is a safe procedure. However, problems may occur, including:  Bleeding.  Infection.  Allergic reactions to medicines or dyes.  Damage to other structures or organs.  Nerve injury.  Decreased interest in sex or pain during sex.  Blood clots that can break free and travel to your lungs. What happens before the procedure? Staying hydrated Follow instructions from your health care provider about hydration, which may include:  Up to 2 hours before the procedure - you may continue to drink clear liquids, such as water, clear fruit juice, black coffee, and plain tea Eating and  drinking restrictions Follow instructions from your health care provider about eating and drinking, which may include:  8 hours before the procedure - stop eating heavy meals or foods such as meat, fried foods, or fatty foods.  6 hours before the procedure - stop eating light meals or foods, such as toast or cereal.  6 hours before the procedure - stop drinking milk or drinks that contain milk.  2 hours before the procedure - stop drinking clear liquids. Medicines  Ask your health care provider about: ? Changing or stopping your regular medicines. This is especially important if you are taking diabetes medicines or blood thinners. ? Taking medicines such as aspirin and ibuprofen. These medicines can thin your blood. Do not take these medicines before your procedure if your health care provider instructs you not to.  You may be given antibiotic medicine to help prevent infection. Take it as told by your health care provider.  You may be asked to take laxatives to prevent constipation. General instructions  Ask your health care provider how your surgical site will be marked or identified.  You may be asked to shower with a germ-killing soap.  Plan to have someone take you home from the hospital.  Do not use any products that contain nicotine or tobacco, such as cigarettes and e-cigarettes. If you need help quitting, ask your health care provider.  You may have an exam or testing.  You may have a blood or urine sample taken.  You may need to have an enema to clean out your rectum and lower colon.  This procedure can affect the way   you feel about yourself. Talk to your health care provider about the physical and emotional changes this procedure may cause. What happens during the procedure?  To lower your risk of infection: ? Your health care team will wash or sanitize their hands. ? Your skin will be washed with soap. ? Hair may be removed from the surgical area.  An IV tube  will be inserted into one of your veins.  You will be given one or more of the following: ? A medicine to help you relax (sedative). ? A medicine to make you fall asleep (general anesthetic).  Tight-fitting (compression) stockings will be placed on your legs to promote circulation.  A thin, flexible tube (catheter) will be inserted to help drain your urine.  The surgeon will make a cut (incision) through the skin in your lower belly. The incision may go side-to-side or up-and-down.  The surgeon will move aside the body tissue that covers your uterus. The surgeon will then carefully take out your uterus along with any of the other organs that need to be removed.  Bleeding will be controlled with clamps or sutures.  The surgeon will close your incision with stitches (sutures), skin glue, or adhesive strips.  A bandage (dressing) will be placed over the incision. The procedure may vary among health care providers and hospitals. What happens after the procedure?  You will be given pain medicine as needed.  Your blood pressure, heart rate, breathing rate, and blood oxygen level will be monitored until the medicines you were given have worn off.  You will need to stay in the hospital to recover for one to two days. Ask your health care provider how long you will need to stay in the hospital after your procedure.  You may have a liquid diet at first. You will most likely return to your usual diet the day after surgery.  You will still have the urinary catheter in place. It will likely be removed the day after surgery.  You may have to wear compression stockings. These stockings help to prevent blood clots and reduce swelling in your legs.  You will be encouraged to walk as soon as possible. You will also use a device or do breathing exercises to keep your lungs clear.  You may need to use a sanitary napkin for vaginal discharge. Summary  Abdominal hysterectomy is a surgical procedure  to remove the womb (uterus). The uterus is the muscular organ that houses a developing baby.  This procedure can affect the way you feel about yourself. Talk to your health care provider about the physical and emotional changes this procedure may cause.  You will be given medicines for pain after the procedure.  You will need to stay in the hospital to recover. Ask your health care provider how long you will need to stay in the hospital after your procedure. This information is not intended to replace advice given to you by your health care provider. Make sure you discuss any questions you have with your health care provider. Document Revised: 04/20/2018 Document Reviewed: 03/04/2016 Elsevier Patient Education  2020 Elsevier Inc.  

## 2019-07-20 NOTE — Progress Notes (Signed)
POD#1 TAH/Bilateral Salpingectomy  Subjective: Patient reports tolerating PO and no problems voiding.    Objective: I have reviewed patient's vital signs.  vital signs, intake and output, medications and labs.  Vitals:   07/20/19 0545 07/20/19 0809  BP: 115/73 122/67  Pulse: 70 87  Resp: 16 18  Temp: 98.7 F (37.1 C) 98.1 F (36.7 C)  SpO2: 97% 100%   I/O last 3 completed shifts: In: [id number] [P.O.:200; I.V.:3275; IV Piggyback:100] Out: 2500 [Urine:2300; Blood:200] Total I/O In: 200 [P.O.:200] Out: 350 [Urine:350]  Results for orders placed or performed during the hospital encounter of 07/19/19 (from the past 24 hour(s))  CBC     Status: Abnormal   Collection Time: 07/20/19  5:00 AM  Result Value Ref Range   WBC 10.7 (H) 4.0 - 10.5 K/uL   RBC 3.84 (L) 3.87 - 5.11 MIL/uL   Hemoglobin 12.1 12.0 - 15.0 g/dL   HCT 37.8 36.0 - 46.0 %   MCV 98.4 80.0 - 100.0 fL   MCH 31.5 26.0 - 34.0 pg   MCHC 32.0 30.0 - 36.0 g/dL   RDW 13.4 11.5 - 15.5 %   Platelets 295 150 - 400 K/uL   nRBC 0.0 0.0 - 0.2 %    EXAM General: alert and cooperative Resp: clear to auscultation bilaterally Cardio: regular rate and rhythm GI: soft, non-tender; bowel sounds normal; no masses,  no organomegaly and incision: clean, dry and intact Extremities: no edema, redness or tenderness in the calves or thighs Vaginal Bleeding: none  Assessment: s/p Procedure(s): HYSTERECTOMY ABDOMINAL WITH SALPINGECTOMY: stable, progressing well and tolerating diet  Plan: Discharge home  LOS: 0 days    Princess Bruins, MD 07/20/2019 8:57 AM

## 2019-07-20 NOTE — Discharge Summary (Signed)
Physician Discharge Summary  Patient ID: Linda Chaney MRN: [id number] DOB/AGE: 05-01-1970 49 y.o.  Admit date: 07/19/2019 Discharge date: 07/20/2019  Admission Diagnoses: Postoperative state [Z98.890] Post-operative state [Z98.890]   Discharge Diagnoses: Fibroid Active Problems:   Postoperative state   Post-operative state   Discharged Condition: good  Consults:None  Significant Diagnostic Studies: labs: Hb 12.1 postop  Treatments:surgery: Total Abdominal Hysterectomy with Bilateral Salpingectomy  Vitals:   07/20/19 0545 07/20/19 0809  BP: 115/73 122/67  Pulse: 70 87  Resp: 16 18  Temp: 98.7 F (37.1 C) 98.1 F (36.7 C)  SpO2: 97% 100%     Total I/O In: 200 [P.O.:200] Out: [place] Hospital Course: Good  Discharge Exam: Normal postop  Disposition: Discharge disposition: 01-Home or Self Care       Discharge Instructions    Discharge patient   Complete by: As directed    Discharge disposition: 01-Home or Self Care   Discharge patient date: 07/20/2019       Allergies as of 07/20/2019   No Known Allergies     Medication List    TAKE these medications   clindamycin 1 % external solution Commonly known as: CLEOCIN T Apply 1 application topically 2 (two) times daily. What changed: Another medication with the same name was added. Make sure you understand how and when to take each.   clindamycin 1 % external solution Commonly known as: CLEOCIN T Apply 1 application topically 2 (two) times daily. What changed: You were already taking a medication with the same name, and this prescription was added. Make sure you understand how and when to take each.   Fluocin-Hydroquinone-Tretinoin 0.01-4-0.05 % Crea Apply 1 application topically 2 (two) times a week.   oxyCODONE-acetaminophen 7.5-325 MG tablet Commonly known as: Percocet Take 1 tablet by mouth every 6 (six) hours as needed for severe pain.        Follow-up Information    Princess Bruins, MD Follow up in 3 week(s).   Specialty: Obstetrics and Gynecology Contact information: [address] [place] [address] [phone]            Signed: Princess Bruins 07/20/2019, 8:55 AM

## 2019-08-07 ENCOUNTER — Other Ambulatory Visit: Payer: Self-pay

## 2019-08-07 ENCOUNTER — Encounter: Payer: Self-pay | Admitting: Obstetrics & Gynecology

## 2019-08-07 ENCOUNTER — Ambulatory Visit (INDEPENDENT_AMBULATORY_CARE_PROVIDER_SITE_OTHER): Payer: BC Managed Care – PPO | Admitting: Obstetrics & Gynecology

## 2019-08-07 VITALS — BP 132/86

## 2019-08-07 DIAGNOSIS — Z09 Encounter for follow-up examination after completed treatment for conditions other than malignant neoplasm: Secondary | ICD-10-CM

## 2019-08-07 NOTE — Progress Notes (Signed)
    Linda Chaney December 10, 1970 [id number]        49 y.o.  G0   RP: Postop 3 weeks after TAH/Bilateral Salpingectomy  HPI: Doing very well.  No abdo-pelvic pain.  No vaginal bleeding.  No vaginal d/c.  Urine/BMs normal.  No fever.   OB History  Gravida Para Term Preterm AB Living  0 0 0 0 0 0  SAB TAB Ectopic Multiple Live Births  0 0 0 0 0    Past medical history,surgical history, problem list, medications, allergies, family history and social history were all reviewed and documented in the EPIC chart.   Directed ROS with pertinent positives and negatives documented in the history of present illness/assessment and plan.  Exam:  Vitals:   08/07/19 1429  BP: 132/86   General appearance:  Normal  Abdomen: Normal.  Incision well closed, no erythema, no induration  Gynecologic exam: Vulva normal.  Speculum:  Vaginal vault closed, no erythema, no abnormal d/c, no active bleeding.  Mild blood on speculum after touching the vaginal vault.   Assessment/Plan:  49 y.o. G0  1. Status post gynecological surgery, follow-up exam Good postop healing with no complication.  Pfannenstiel incision and vaginal vault well closed.  Will get back to work at 6 weeks.  Continue to avoid pelvic pressure and sexual activities.  F/U reassessment of the vaginal vault in 4 weeks.   Princess Bruins MD, 2:51 PM 08/07/2019

## 2019-08-07 NOTE — Patient Instructions (Signed)
1. Status post gynecological surgery, follow-up exam Good postop healing with no complication.  Pfannenstiel incision and vaginal vault well closed.  Will get back to work at 6 weeks.  Continue to avoid pelvic pressure and sexual activities.  F/U reassessment of the vaginal vault in 4 weeks.  Linda Chaney, it was a pleasure seeing you today!

## 2019-08-10 ENCOUNTER — Ambulatory Visit: Payer: BC Managed Care – PPO | Admitting: Obstetrics & Gynecology

## 2019-09-04 ENCOUNTER — Ambulatory Visit: Payer: BC Managed Care – PPO | Admitting: Obstetrics & Gynecology

## 2019-09-06 ENCOUNTER — Other Ambulatory Visit: Payer: Self-pay

## 2019-09-06 ENCOUNTER — Encounter: Payer: Self-pay | Admitting: Obstetrics & Gynecology

## 2019-09-06 ENCOUNTER — Ambulatory Visit (INDEPENDENT_AMBULATORY_CARE_PROVIDER_SITE_OTHER): Payer: BC Managed Care – PPO | Admitting: Obstetrics & Gynecology

## 2019-09-06 VITALS — BP 120/80

## 2019-09-06 DIAGNOSIS — Z6833 Body mass index (BMI) 33.0-33.9, adult: Secondary | ICD-10-CM

## 2019-09-06 DIAGNOSIS — K5904 Chronic idiopathic constipation: Secondary | ICD-10-CM

## 2019-09-06 DIAGNOSIS — E6609 Other obesity due to excess calories: Secondary | ICD-10-CM

## 2019-09-06 DIAGNOSIS — Z09 Encounter for follow-up examination after completed treatment for conditions other than malignant neoplasm: Secondary | ICD-10-CM

## 2019-09-06 NOTE — Progress Notes (Signed)
    Linda Chaney Jan 20, 1971 [id number]        49 y.o.  G0    RP: Postop TAH/Bilateral Salpingectomy 07/19/19  HPI: Very good progression.  Had an episode of severe constipation about 3 weeks ago.  Normal bowel movements now.  Urine normal.  No vaginal bleeding and no vaginal discharge.  No abdominal pelvic pain.  No fever.   OB History  Gravida Para Term Preterm AB Living  0 0 0 0 0 0  SAB TAB Ectopic Multiple Live Births  0 0 0 0 0    Past medical history,surgical history, problem list, medications, allergies, family history and social history were all reviewed and documented in the EPIC chart.   Directed ROS with pertinent positives and negatives documented in the history of present illness/assessment and plan.  Exam:  Vitals:   09/06/19 1545  BP: 120/80   General appearance:  Normal  Abdomen: Soft, nontender.  Incision well closed with no erythema, no induration.  Gynecologic exam: Vulva normal.  Vaginal exam: Vaginal vault well closed with no induration and no tenderness.  No pelvic mass felt.  No vaginal bleeding and normal secretions.   Assessment/Plan:  49 y.o. G0  1. Status post gynecological surgery, follow-up exam Excellent postop healing.  No complication.  Can resume all physical activities and sexual activities.  Next visit for annual gynecologic exam.  2. Chronic idiopathic constipation Decision to refer to [facility] to modify nutrition in the context of chronic constipation.  3. Class 1 obesity due to excess calories without serious comorbidity with body mass index (BMI) of 33.0 to 33.9 in adult Referred to [facility] nutrition center for healthy nutrition and weight loss.  Aerobic activities 5 times a week and light weightlifting every 2 days.  Princess Bruins MD, 4:02 PM 09/06/2019

## 2019-09-07 ENCOUNTER — Telehealth: Payer: Self-pay | Admitting: *Deleted

## 2019-09-07 DIAGNOSIS — E6609 Other obesity due to excess calories: Secondary | ICD-10-CM

## 2019-09-07 NOTE — Telephone Encounter (Signed)
-----   Message from Princess Bruins, MD sent at 09/06/2019  4:15 PM EDT ----- Regarding: Refer to [facility] [place] Chronic constipation and obesity.  Refer to [facility].

## 2019-09-07 NOTE — Telephone Encounter (Signed)
Referral [facility] they will call to schedule.

## 2019-09-10 ENCOUNTER — Encounter: Payer: Self-pay | Admitting: Obstetrics & Gynecology

## 2019-09-10 NOTE — Patient Instructions (Signed)
1. Status post gynecological surgery, follow-up exam Excellent postop healing.  No complication.  Can resume all physical activities and sexual activities.  Next visit for annual gynecologic exam.  2. Chronic idiopathic constipation Decision to refer to [facility] to modify nutrition in the context of chronic constipation.  3. Class 1 obesity due to excess calories without serious comorbidity with body mass index (BMI) of 33.0 to 33.9 in adult Referred to [facility] nutrition center for healthy nutrition and weight loss.  Aerobic activities 5 times a week and light weightlifting every 2 days.  Linda Chaney, it was a pleasure seeing you today!

## 2019-09-12 NOTE — Telephone Encounter (Addendum)
Message was left to call and schedule by [facility]

## 2019-09-22 NOTE — Telephone Encounter (Signed)
Patient scheduled on 11/06/19

## 2019-11-06 ENCOUNTER — Other Ambulatory Visit: Payer: Self-pay

## 2019-11-06 ENCOUNTER — Encounter: Payer: BC Managed Care – PPO | Attending: Obstetrics & Gynecology | Admitting: Skilled Nursing Facility1

## 2019-11-06 ENCOUNTER — Encounter: Payer: Self-pay | Admitting: Skilled Nursing Facility1

## 2019-11-06 DIAGNOSIS — Z6834 Body mass index (BMI) 34.0-34.9, adult: Secondary | ICD-10-CM | POA: Insufficient documentation

## 2019-11-06 DIAGNOSIS — E669 Obesity, unspecified: Secondary | ICD-10-CM | POA: Diagnosis not present

## 2019-11-06 NOTE — Progress Notes (Signed)
  Assessment:  Primary concerns today: weight loss.  Pt states in her early 79's she was 163 pounds until about 48 years old. Pt states she is more stationary at work but cannot figure out why she gained that weight.  Pt states she jogs, crunches, push ups, weight lifting, treadmill, elliptical: 2 times a week 60 minutes out of breath/increased heart rate 10% of that time: for the last 6 weeks. Weight being 220-225 pounds; some walking at work. Pt states she does rely on miralax and activia for bowel movements. Pt states she has a bowel movement every 2 days on a good week but without ease. Pt states she does emotionally eat. Pt states she does emotionally eat typically on chips at night. Pt states she eats most of her meals out stating it is easier to eat out and she is not much of a cook. Pt states she will be joining a double [place] group which she is excited about and will be transitioning away from her trainer due to time.    Body Composition Scale 11/06/2019  Current Body Weight 224.8  Total Body Fat % 41.6  Visceral Fat 13  Fat-Free Mass % 58.3   Total Body Water % 43.6  Muscle-Mass lbs 32.6  BMI 36.2  Body Fat Displacement          Torso  lbs 56         Left Leg  lbs 11.6         Right Leg  lbs 11.6         Left Arm  lbs 5.8         Right Arm   lbs 5.8    MEDICATIONS: N/A   DIETARY INTAKE:  Usual eating pattern includes 2-3 meals and 1-2 snacks per day.  Everyday foods include none stated.  Avoided foods include none stated    24-hr recall: 3 meals a day 4-5 days a week B ( AM): activia + fruit or skipped Snk ( AM):  L ( PM): skipped or prepackaged salad Snk ( PM): fruit D ( PM): shrimp and grits + bread Snk ( PM):  Beverages: 72-80 ounces of water, diet dr pepper  Usual physical activity: 2 days a week with trainer   Estimated energy needs: 1500 calories  Progress Towards Goal(s):  In progress.    Intervention:  Nutrition counseling. Dietitian educated pt on  weight managment within the context of emotional eating. Goals: Try diabetes food hub for recipes Aim to eat non starchy vegetables 2 times a day 7 days a week Aim to be more mindful with your body to gain trust and confidence   Teaching Method Utilized:  Visual Auditory Hands on  Handouts given during visit include:  Meal ideas  Barriers to learning/adherence to lifestyle change: emotional eating  Demonstrated degree of understanding via:  Teach Back   Monitoring/Evaluation:  Dietary intake, exercise,  and body weight prn.

## 2019-12-13 ENCOUNTER — Encounter: Payer: BC Managed Care – PPO | Attending: Obstetrics & Gynecology | Admitting: Skilled Nursing Facility1

## 2019-12-13 ENCOUNTER — Other Ambulatory Visit: Payer: Self-pay

## 2019-12-13 DIAGNOSIS — Z6834 Body mass index (BMI) 34.0-34.9, adult: Secondary | ICD-10-CM | POA: Insufficient documentation

## 2019-12-13 DIAGNOSIS — E669 Obesity, unspecified: Secondary | ICD-10-CM

## 2019-12-13 NOTE — Progress Notes (Signed)
  Assessment:  Primary concerns today: weight loss.  Pt states she has been working longer hours with work lately.  Pt state she has not been snacking at night.  Pt states she was constipated from not using miralax for about 2 or 3 weeks: dietitian advised pt not to go longer than 3 days without a bowel movement before utilizing an intervention be sure to speak with your doctor on this topic.   Pt states she is disappointed she did not lose more weight.    Body Composition Scale 11/06/2019 12/13/2019  Current Body Weight 224.8 221.8  Total Body Fat % 41.6 41.3  Visceral Fat 13 12  Fat-Free Mass % 58.3 58.6   Total Body Water % 43.6 43.8  Muscle-Mass lbs 32.6 32.6  BMI 36.2 35.7  Body Fat Displacement           Torso  lbs 56 56.7         Left Leg  lbs 11.6 11.3         Right Leg  lbs 11.6 11.3         Left Arm  lbs 5.8 5.6         Right Arm   lbs 5.8 5.6    MEDICATIONS: N/A   DIETARY INTAKE:  Usual eating pattern includes 2-3 meals and 1-2 snacks per day.  Everyday foods include none stated.  Avoided foods include none stated    24-hr recall: 3 meals a day 4-5 days a week B ( AM): activia + fruit or skipped or kefir and miralax  Snk (11:30- 12 AM): fruit L ( PM): skipped or prepackaged salad or baked chicken + vegetable  Snk ( PM): fruit D ( PM): shrimp and grits + bread or chicken BBQ+ side salad  Snk ( PM):  Beverages: 72-80 ounces of water, diet dr pepper, flavorings in water  Usual physical activity: ADL's  Estimated energy needs: 1500 calories  Progress Towards Goal(s):  In progress.    Intervention:  Nutrition counseling. Dietitian educated pt on weight managment within the context of emotional eating. Goals: Try diabetes food hub for recipes Aim to eat non starchy vegetables 2 times a day 7 days a week [place] job on reducing emotional eating!   Teaching Method Utilized:  Visual Auditory Hands on  Handouts given during visit include:  Meal  ideas  Barriers to learning/adherence to lifestyle change: emotional eating  Demonstrated degree of understanding via:  Teach Back   Monitoring/Evaluation:  Dietary intake, exercise,  and body weight prn.

## 2019-12-28 ENCOUNTER — Other Ambulatory Visit: Payer: BC Managed Care – PPO

## 2019-12-28 DIAGNOSIS — Z20822 Contact with and (suspected) exposure to covid-19: Secondary | ICD-10-CM

## 2019-12-29 LAB — SARS-COV-2, NAA 2 DAY TAT

## 2019-12-29 LAB — NOVEL CORONAVIRUS, NAA: SARS-CoV-2, NAA: NOT DETECTED

## 2020-02-15 ENCOUNTER — Encounter: Payer: Self-pay | Admitting: Nurse Practitioner

## 2020-02-15 ENCOUNTER — Other Ambulatory Visit: Payer: Self-pay

## 2020-02-15 ENCOUNTER — Ambulatory Visit (INDEPENDENT_AMBULATORY_CARE_PROVIDER_SITE_OTHER): Payer: BC Managed Care – PPO | Admitting: Nurse Practitioner

## 2020-02-15 VITALS — BP 124/84 | HR 100 | Temp 97.4°F | Ht 67.0 in | Wt 224.0 lb

## 2020-02-15 DIAGNOSIS — Z0001 Encounter for general adult medical examination with abnormal findings: Secondary | ICD-10-CM

## 2020-02-15 DIAGNOSIS — Z800 Family history of malignant neoplasm of digestive organs: Secondary | ICD-10-CM | POA: Insufficient documentation

## 2020-02-15 DIAGNOSIS — K5900 Constipation, unspecified: Secondary | ICD-10-CM | POA: Diagnosis not present

## 2020-02-15 DIAGNOSIS — E559 Vitamin D deficiency, unspecified: Secondary | ICD-10-CM | POA: Diagnosis not present

## 2020-02-15 DIAGNOSIS — Z6835 Body mass index (BMI) 35.0-35.9, adult: Secondary | ICD-10-CM | POA: Diagnosis not present

## 2020-02-15 LAB — LIPID PANEL
Cholesterol: 173 mg/dL (ref 0–200)
HDL: 59.9 mg/dL (ref >39.00)
LDL Cholesterol: 105 mg/dL — ABNORMAL HIGH (ref 0–99)
NonHDL: 113.11
Total CHOL/HDL Ratio: 3
Triglycerides: 40 mg/dL (ref 0.0–149.0)
VLDL: 8 mg/dL (ref 0.0–40.0)

## 2020-02-15 LAB — CBC WITH DIFFERENTIAL/PLATELET
Basophils Absolute: 0 10*3/uL (ref 0.0–0.1)
Basophils Relative: 0.9 % (ref 0.0–3.0)
Eosinophils Absolute: 0.1 10*3/uL (ref 0.0–0.7)
Eosinophils Relative: 2.9 % (ref 0.0–5.0)
HCT: 41 % (ref 36.0–46.0)
Hemoglobin: 13.4 g/dL (ref 12.0–15.0)
Lymphocytes Relative: 30.3 % (ref 12.0–46.0)
Lymphs Abs: 1.1 10*3/uL (ref 0.7–4.0)
MCHC: 32.7 g/dL (ref 30.0–36.0)
MCV: 94.9 fl (ref 78.0–100.0)
Monocytes Absolute: 0.4 10*3/uL (ref 0.1–1.0)
Monocytes Relative: 10.7 % (ref 3.0–12.0)
Neutro Abs: 2.1 10*3/uL (ref 1.4–7.7)
Neutrophils Relative %: 55.2 % (ref 43.0–77.0)
Platelets: 328 10*3/uL (ref 150.0–400.0)
RBC: 4.32 Mil/uL (ref 3.87–5.11)
RDW: 14.4 % (ref 11.5–15.5)
WBC: 3.8 10*3/uL — ABNORMAL LOW (ref 4.0–10.5)

## 2020-02-15 LAB — COMPREHENSIVE METABOLIC PANEL
ALT: 28 U/L (ref 0–35)
AST: 27 U/L (ref 0–37)
Albumin: 3.9 g/dL (ref 3.5–5.2)
Alkaline Phosphatase: 80 U/L (ref 39–117)
BUN: 15 mg/dL (ref 6–23)
CO2: 27 mEq/L (ref 19–32)
Calcium: 9 mg/dL (ref 8.4–10.5)
Chloride: 107 mEq/L (ref 96–112)
Creatinine, Ser: 0.65 mg/dL (ref 0.40–1.20)
GFR: 103.8 mL/min (ref >60.00)
Glucose, Bld: 87 mg/dL (ref 70–99)
Potassium: 4.2 mEq/L (ref 3.5–5.1)
Sodium: 139 mEq/L (ref 135–145)
Total Bilirubin: 0.5 mg/dL (ref 0.2–1.2)
Total Protein: 7.3 g/dL (ref 6.0–8.3)

## 2020-02-15 LAB — TSH: TSH: 0.98 u[IU]/mL (ref 0.35–4.50)

## 2020-02-15 LAB — HEMOGLOBIN A1C: Hgb A1c MFr Bld: 5.1 % (ref 4.6–6.5)

## 2020-02-15 MED ORDER — LUBIPROSTONE 8 MCG PO CAPS: 8.0000 ug | ORAL_CAPSULE | Freq: Every day | ORAL | 5 refills | Status: DC

## 2020-02-15 NOTE — Patient Instructions (Signed)
Go to lab for blood draw Will send phentermine after review of lab results. F/up in 27month after start of phentermine  Consider ref to GI if no improvement with amitiza.  Health Maintenance, Female Adopting a healthy lifestyle and getting preventive care are important in promoting health and wellness. Ask your health care provider about:  The right schedule for you to have regular tests and exams.  Things you can do on your own to prevent diseases and keep yourself healthy. What should I know about diet, weight, and exercise? Eat a healthy diet   Eat a diet that includes plenty of vegetables, fruits, low-fat dairy products, and lean protein.  Do not eat a lot of foods that are high in solid fats, added sugars, or sodium. Maintain a healthy weight Body mass index (BMI) is used to identify weight problems. It estimates body fat based on height and weight. Your health care provider can help determine your BMI and help you achieve or maintain a healthy weight. Get regular exercise Get regular exercise. This is one of the most important things you can do for your health. Most adults should:  Exercise for at least 150 minutes each week. The exercise should increase your heart rate and make you sweat (moderate-intensity exercise).  Do strengthening exercises at least twice a week. This is in addition to the moderate-intensity exercise.  Spend less time sitting. Even light physical activity can be beneficial. Watch cholesterol and blood lipids Have your blood tested for lipids and cholesterol at 48 years of age, then have this test every 5 years. Have your cholesterol levels checked more often if:  Your lipid or cholesterol levels are high.  You are older than 48 years of age.  You are at high risk for heart disease. What should I know about cancer screening? Depending on your health history and family history, you may need to have cancer screening at various ages. This may include  screening for:  Breast cancer.  Cervical cancer.  Colorectal cancer.  Skin cancer.  Lung cancer. What should I know about heart disease, diabetes, and high blood pressure? Blood pressure and heart disease  High blood pressure causes heart disease and increases the risk of stroke. This is more likely to develop in people who have high blood pressure readings, are of African descent, or are overweight.  Have your blood pressure checked: ? Every 3-5 years if you are 48-48 years of age. ? Every year if you are 48 years old or older. Diabetes Have regular diabetes screenings. This checks your fasting blood sugar level. Have the screening done:  Once every three years after age 36 if you are at a normal weight and have a low risk for diabetes.  More often and at a younger age if you are overweight or have a high risk for diabetes. What should I know about preventing infection? Hepatitis B If you have a higher risk for hepatitis B, you should be screened for this virus. Talk with your health care provider to find out if you are at risk for hepatitis B infection. Hepatitis C Testing is recommended for:  Everyone born from [birth year] through 1965.  Anyone with known risk factors for hepatitis C. Sexually transmitted infections (STIs)  Get screened for STIs, including gonorrhea and chlamydia, if: ? You are sexually active and are younger than 48 years of age. ? You are older than 48 years of age and your health care provider tells you that you are at risk  for this type of infection. ? Your sexual activity has changed since you were last screened, and you are at increased risk for chlamydia or gonorrhea. Ask your health care provider if you are at risk.  Ask your health care provider about whether you are at high risk for HIV. Your health care provider may recommend a prescription medicine to help prevent HIV infection. If you choose to take medicine to prevent HIV, you should first get  tested for HIV. You should then be tested every 3 months for as long as you are taking the medicine. Pregnancy  If you are about to stop having your period (premenopausal) and you may become pregnant, seek counseling before you get pregnant.  Take 400 to 800 micrograms (mcg) of folic acid every day if you become pregnant.  Ask for birth control (contraception) if you want to prevent pregnancy. Osteoporosis and menopause Osteoporosis is a disease in which the bones lose minerals and strength with aging. This can result in bone fractures. If you are 74 years old or older, or if you are at risk for osteoporosis and fractures, ask your health care provider if you should:  Be screened for bone loss.  Take a calcium or vitamin D supplement to lower your risk of fractures.  Be given hormone replacement therapy (HRT) to treat symptoms of menopause. Follow these instructions at home: Lifestyle  Do not use any products that contain nicotine or tobacco, such as cigarettes, e-cigarettes, and chewing tobacco. If you need help quitting, ask your health care provider.  Do not use street drugs.  Do not share needles.  Ask your health care provider for help if you need support or information about quitting drugs. Alcohol use  Do not drink alcohol if: ? Your health care provider tells you not to drink. ? You are pregnant, may be pregnant, or are planning to become pregnant.  If you drink alcohol: ? Limit how much you use to 0-1 drink a day. ? Limit intake if you are breastfeeding.  Be aware of how much alcohol is in your drink. In the [place] one drink equals one 12 oz bottle of beer (355 mL), one 5 oz glass of wine (148 mL), or one 1 oz glass of hard liquor (44 mL). General instructions  Schedule regular health, dental, and eye exams.  Stay current with your vaccines.  Tell your health care provider if: ? You often feel depressed. ? You have ever been abused or do not feel safe at  home. Summary  Adopting a healthy lifestyle and getting preventive care are important in promoting health and wellness.  Follow your health care provider's instructions about healthy diet, exercising, and getting tested or screened for diseases.  Follow your health care provider's instructions on monitoring your cholesterol and blood pressure. This information is not intended to replace advice given to you by your health care provider. Make sure you discuss any questions you have with your health care provider. Document Revised: 03/09/2018 Document Reviewed: 03/09/2018 Elsevier Patient Education  2020 [employer].

## 2020-02-15 NOTE — Progress Notes (Signed)
Subjective:    Patient ID: Linda Chaney, female    DOB: 1971-01-21, 49 y.o.   MRN: [id number]  Patient presents today for CPE and eval of chronic conditions: obesity and constipation  HPI  Sexual History (orientation,birth control, marital status, STD):single, sexually active, s/p hysterectomy, up to date with breast and pelvic exam completed by GYN  Depression/Suicide: Depression screen [facility] 2/9 02/15/2020 02/01/2019  Decreased Interest 0 0  Down, Depressed, Hopeless 0 0  PHQ - 2 Score 0 0   Vision:up to date  Dental:up to date  Immunizations: (TDAP, Hep C screen, Pneumovax, Influenza, zoster)  Health Maintenance  Topic Date Due  .  Hepatitis C: One time screening is recommended by Center for Disease Control  (CDC) for  adults born from [birth year] through 1964.   Never done  . HIV Screening  Never done  . Tetanus Vaccine  Never done  . Flu Shot  Never done  . Pap Smear  01/31/2022  . COVID-19 Vaccine  Completed   Diet:regular  Weight:  Wt Readings from Last 3 Encounters:  02/15/20 224 lb (101.6 kg)  12/13/19 221 lb 12.8 oz (100.6 kg)  11/06/19 224 lb 12.8 oz (102 kg)   Fall Risk: Fall Risk  02/15/2020 11/06/2019 02/01/2019  Falls in the past year? 0 0 0  Number falls in past yr: 0 - -  Injury with Fall? 0 - -   Advanced Directive: Advanced Directives 07/19/2019  Does Patient Have a Medical Advance Directive? No  Would patient like information on creating a medical advance directive? No - Patient declined    Medications and allergies reviewed with patient and updated if appropriate.  Patient Active Problem List   Diagnosis Date Noted  . Constipation 02/18/2020  . Family history of colon cancer in mother 02/15/2020  . Postoperative state 07/19/2019  . Post-operative state 07/19/2019  . Class 1 obesity with serious comorbidity and body mass index (BMI) of 34.0 to 34.9 in adult 02/01/2019  . Pelvic mass in female 02/01/2019  . Irregular menstrual cycle 02/01/2019  .  Hidradenitis suppurativa 01/25/2019  . 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 Acne vulgaris 01/25/2019  . Hyperpigmentation 05/06/2017  . Melasma 02/11/2017  . Traumatic injury to skin or subcutaneous tissue 02/11/2017   Current Outpatient Medications on File Prior to Visit  Medication Sig Dispense Refill  . Fluocin-Hydroquinone-Tretinoin 0.01-4-0.05 % CREA Apply 1 application topically 2 (two) times a week.     . spironolactone (ALDACTONE) 50 MG tablet Take 50 mg by mouth daily.    . Tranexamic Acid (LYSTEDA PO) Take by mouth.     No current facility-administered medications on file prior to visit.    Past Medical History:  Diagnosis Date  . Asthma     Past Surgical History:  Procedure Laterality Date  . CYST REMOVAL TRUNK    . HYSTERECTOMY ABDOMINAL WITH SALPINGECTOMY Bilateral 07/19/2019   Procedure: HYSTERECTOMY ABDOMINAL WITH SALPINGECTOMY;  Surgeon: Princess Bruins, MD;  Location: [facility];  Service: Gynecology;  Laterality: Bilateral;  request 7:30am OR time in [place] [facility] block requests 1 1/2 hours  . MOUTH SURGERY      Social History   Socioeconomic History  . Marital status: Single    Spouse name: Not on file  . Number of children: 0  . Years of education: Not on file  . Highest education level: Not on file  Occupational History  . Not on file  Tobacco Use  . Smoking status: Never Smoker  .  Smokeless tobacco: Never Used  Vaping Use  . Vaping Use: Never used  Substance and Sexual Activity  . Alcohol use: Yes    Comment: SELDOM  . Drug use: Never  . Sexual activity: Yes    Partners: Male    Birth control/protection: Condom    Comment: 1st intercourse- 25, partners- 17, current partner- 3  yrs  Other Topics Concern  . Not on file  Social History Narrative  . Not on file   Social Determinants of Health   Financial Resource Strain:   . Difficulty of Paying Living Expenses: Not on file  Food Insecurity:   . Worried About Ship broker in the Last Year: Not on file  . Ran Out of Food in the Last Year: Not on file  Transportation Needs:   . Lack of Transportation (Medical): Not on file  . Lack of Transportation (Non-Medical): Not on file  Physical Activity:   . Days of Exercise per Week: Not on file  . Minutes of Exercise per Session: Not on file  Stress:   . Feeling of Stress : Not on file  Social Connections:   . Frequency of Communication with Friends and Family: Not on file  . Frequency of Social Gatherings with Friends and Family: Not on file  . Attends Religious Services: Not on file  . Active Member of Clubs or Organizations: Not on file  . Attends Archivist Meetings: Not on file  . Marital Status: Not on file    Family History  Problem Relation Age of Onset  . Hypertension Mother   . Other Mother        [name]  . Hypertension Father   . Cancer Paternal Grandfather        lung cancer due to tobacco use  . Cancer Maternal Grandmother        colon-        Review of Systems  Constitutional: Negative for fever, malaise/fatigue and weight loss.  HENT: Negative for congestion and sore throat.   Eyes:       Negative for visual changes  Respiratory: Negative for cough and shortness of breath.   Cardiovascular: Negative for chest pain, palpitations and leg swelling.  Gastrointestinal: Positive for constipation. Negative for blood in stool, diarrhea and heartburn.  Genitourinary: Negative for dysuria, frequency and urgency.  Musculoskeletal: Negative for falls, joint pain and myalgias.  Skin: Negative for rash.  Neurological: Negative for dizziness, sensory change and headaches.  Endo/Heme/Allergies: Does not bruise/bleed easily.  Psychiatric/Behavioral: Negative for depression, substance abuse and suicidal ideas. The patient is not nervous/anxious.     Objective:   Vitals:   02/15/20 1035  BP: 124/84  Pulse: 100  Temp: (!) 97.4 F (36.3 C)  SpO2: 95%    Body mass  index is 35.08 kg/m.   Physical Examination:  Physical Exam Vitals reviewed.  Constitutional:      General: She is not in acute distress.    Appearance: She is well-developed.  HENT:     Right Ear: Tympanic membrane, ear canal and external ear normal.     Left Ear: Tympanic membrane, ear canal and external ear normal.  Eyes:     Extraocular Movements: Extraocular movements intact.     Conjunctiva/sclera: Conjunctivae normal.  Cardiovascular:     Rate and Rhythm: Normal rate and regular rhythm.     Heart sounds: Normal heart sounds.  Pulmonary:     Effort: Pulmonary effort is normal. No  respiratory distress.     Breath sounds: Normal breath sounds.  Chest:     Chest wall: No tenderness.  Abdominal:     General: Bowel sounds are normal. There is no distension.     Palpations: Abdomen is soft.     Tenderness: There is no abdominal tenderness. There is no guarding.  Genitourinary:    Comments: Deferred breast and pelvic exam to GYN Musculoskeletal:        General: Normal range of motion.     Cervical back: Normal range of motion and neck supple.     Right lower leg: No edema.     Left lower leg: No edema.  Skin:    General: Skin is warm and dry.  Neurological:     Mental Status: She is alert and oriented to person, place, and time.     Deep Tendon Reflexes: Reflexes are normal and symmetric.  Psychiatric:        Mood and Affect: Mood normal.        Behavior: Behavior normal.        Thought Content: Thought content normal.    ASSESSMENT and PLAN: This visit occurred during the SARS-CoV-2 public health emergency.  Safety protocols were in place, including screening questions prior to the visit, additional usage of staff PPE, and extensive cleaning of exam room while observing appropriate contact time as indicated for disinfecting solutions.   Linda Chaney was seen today for annual exam.  Diagnoses and all orders for this visit:  Encounter for preventative adult health care  exam with abnormal findings -     CBC with Differential/Platelet -     Comprehensive metabolic panel  Constipation, unspecified constipation type -     lubiprostone (AMITIZA) 8 MCG capsule; Take 1 capsule (8 mcg total) by mouth daily with breakfast.  Class 2 severe obesity with serious comorbidity and body mass index (BMI) of 35.0 to 35.9 in adult, unspecified obesity type (HCC) -     Lipid panel -     TSH -     Hemoglobin A1c -     phentermine 15 MG capsule; Take 1 capsule (15 mg total) by mouth every morning.  Vitamin D deficiency -     Vitamin D 1,25 dihydroxy      Problem List Items Addressed This Visit      Other   Class 1 obesity with serious comorbidity and body mass index (BMI) of 34.0 to 34.9 in adult    Continuous weight gain despite low carb/low fat diet, decrease daily calorie intake, and daily exercise (45-69mins). Wt Readings from Last 3 Encounters:  02/15/20 224 lb (101.6 kg)  12/13/19 221 lb 12.8 oz (100.6 kg)  11/06/19 224 lb 12.8 oz (102 kg)   Stable hgbA1c, CMP and TSH Start phentermine 15mg . Advised about risk of worsening constipation, mood swing and insomnia F/up in 71month.      Relevant Medications   phentermine 15 MG capsule   Constipation    Chronic, onset 1year ago, waxing and waning, associated with ABD bloating, some improvement with miralax. No melena, no hematochezia, no weight loss, no nausea Amitiza rx sent      Relevant Medications   lubiprostone (AMITIZA) 8 MCG capsule    Other Visit Diagnoses    Encounter for preventative adult health care exam with abnormal findings    -  Primary   Relevant Orders   CBC with Differential/Platelet (Completed)   Comprehensive metabolic panel (Completed)   Vitamin D deficiency  Relevant Orders   Vitamin D 1,25 dihydroxy      Follow up: Return in about 4 weeks (around 03/14/2020) for weight management.  Wilfred Lacy, NP

## 2020-02-18 ENCOUNTER — Encounter: Payer: Self-pay | Admitting: Nurse Practitioner

## 2020-02-18 DIAGNOSIS — K5900 Constipation, unspecified: Secondary | ICD-10-CM | POA: Insufficient documentation

## 2020-02-18 MED ORDER — PHENTERMINE HCL 15 MG PO CAPS: 15.0000 mg | ORAL_CAPSULE | ORAL | 0 refills | Status: DC

## 2020-02-18 NOTE — Assessment & Plan Note (Signed)
Chronic, onset 1year ago, waxing and waning, associated with ABD bloating, some improvement with miralax. No melena, no hematochezia, no weight loss, no nausea Amitiza rx sent

## 2020-02-18 NOTE — Assessment & Plan Note (Signed)
Continuous weight gain despite low carb/low fat diet, decrease daily calorie intake, and daily exercise (45-80mins). Wt Readings from Last 3 Encounters:  02/15/20 224 lb (101.6 kg)  12/13/19 221 lb 12.8 oz (100.6 kg)  11/06/19 224 lb 12.8 oz (102 kg)   Stable hgbA1c, CMP and TSH Start phentermine 15mg . Advised about risk of worsening constipation, mood swing and insomnia F/up in 17month.

## 2020-02-19 LAB — VITAMIN D 1,25 DIHYDROXY
Vitamin D 1, 25 (OH)2 Total: 48 pg/mL (ref 18–72)
Vitamin D2 1, 25 (OH)2: <8 pg/mL
Vitamin D3 1, 25 (OH)2: 48 pg/mL

## 2020-02-19 NOTE — Telephone Encounter (Signed)
Status: Sent to Plan today Drug: Phentermine HCl 15MG  capsules Form [employer] Electronic [place] Form [phone] NCPDP

## 2020-03-12 ENCOUNTER — Ambulatory Visit: Payer: BC Managed Care – PPO | Admitting: Nurse Practitioner

## 2020-04-04 ENCOUNTER — Other Ambulatory Visit: Payer: Self-pay

## 2020-04-05 ENCOUNTER — Ambulatory Visit: Payer: BC Managed Care – PPO | Admitting: Nurse Practitioner

## 2020-04-05 ENCOUNTER — Encounter: Payer: Self-pay | Admitting: Nurse Practitioner

## 2020-04-05 VITALS — BP 114/82 | HR 93 | Temp 97.3°F | Ht 66.0 in | Wt 227.0 lb

## 2020-04-05 DIAGNOSIS — K5900 Constipation, unspecified: Secondary | ICD-10-CM | POA: Diagnosis not present

## 2020-04-05 DIAGNOSIS — Z800 Family history of malignant neoplasm of digestive organs: Secondary | ICD-10-CM | POA: Diagnosis not present

## 2020-04-05 DIAGNOSIS — Z6836 Body mass index (BMI) 36.0-36.9, adult: Secondary | ICD-10-CM

## 2020-04-05 NOTE — Assessment & Plan Note (Signed)
Unable to tolerate phentermine due to severe constipation Avoid victoza or saxenda or ozempic at this time due to constipation. She did not find appt with nutritionist to be benficial.  Continue heart healthy diet, small portions and regular exercise at this time.

## 2020-04-05 NOTE — Assessment & Plan Note (Signed)
Minimal improvement with miralax, high fiber diet and soothe move tea. Associated with ABD pain, bloating and straining during BM.  Entered referral to GI

## 2020-04-05 NOTE — Patient Instructions (Addendum)
Continue current diet and exercise You will be contacted to schedule an appt with GI

## 2020-04-05 NOTE — Progress Notes (Signed)
Subjective:  Patient ID: Linda Chaney, female    DOB: 1970/10/23  Age: 50 y.o. MRN: [id number]  CC: Follow-up (4 week f/u on weight management, pt also would like to discuss constipation issues because medication prescribed 3 weeks ago did not help. Declines flu vaccine)   Constipation This is a chronic problem. The current episode started more than 1 year ago. The problem has been waxing and waning since onset. Her stool frequency is 2 to 3 times per week. The stool is described as formed and pellet like. The patient is on a high fiber diet. She exercises regularly. There has been adequate water intake. Associated symptoms include abdominal pain, anorexia and bloating. Pertinent negatives include no back pain, diarrhea, difficulty urinating, fecal incontinence, fever, flatus, hematochezia, hemorrhoids, melena, nausea, rectal pain, vomiting or weight loss. Risk factors include obesity. She has tried diet changes, laxatives, stool softeners and fiber (amitiza) for the symptoms. The treatment provided mild relief.   Wt Readings from Last 3 Encounters:  04/05/20 227 lb (103 kg)  02/15/20 224 lb (101.6 kg)  12/13/19 221 lb 12.8 oz (100.6 kg)   Reviewed past Medical, Social and Family history today.  Outpatient Medications Prior to Visit  Medication Sig Dispense Refill  . Fluocin-Hydroquinone-Tretinoin 0.01-4-0.05 % CREA Apply 1 application topically 2 (two) times a week.    . Tranexamic Acid (LYSTEDA PO) Take by mouth.    . lubiprostone (AMITIZA) 8 MCG capsule Take 1 capsule (8 mcg total) by mouth daily with breakfast. (Patient not taking: Reported on 04/05/2020) 30 capsule 5  . phentermine 15 MG capsule Take 1 capsule (15 mg total) by mouth every morning. (Patient not taking: Reported on 04/05/2020) 30 capsule 0  . spironolactone (ALDACTONE) 50 MG tablet Take 50 mg by mouth daily. (Patient not taking: Reported on 04/05/2020)     No facility-administered medications prior to visit.   ROS See  HPI  Objective:  BP 114/82 (BP Location: Left Arm, Patient Position: Sitting, Cuff Size: Large)   Pulse 93   Temp (!) 97.3 F (36.3 C) (Temporal)   Ht 5\' 6"  (1.676 m)   Wt 227 lb (103 kg)   LMP 05/25/2019   SpO2 98%   BMI 36.64 kg/m   Physical Exam Constitutional:      Appearance: She is obese.  Cardiovascular:     Rate and Rhythm: Normal rate.     Pulses: Normal pulses.  Pulmonary:     Effort: Pulmonary effort is normal.  Abdominal:     General: There is no distension.     Palpations: Abdomen is soft.     Tenderness: There is no abdominal tenderness.  Neurological:     Mental Status: She is alert.    Assessment & Plan:  This visit occurred during the SARS-CoV-2 public health emergency.  Safety protocols were in place, including screening questions prior to the visit, additional usage of staff PPE, and extensive cleaning of exam room while observing appropriate contact time as indicated for disinfecting solutions.   Jay was seen today for follow-up.  Diagnoses and all orders for this visit:  Class 2 severe obesity with serious comorbidity and body mass index (BMI) of 36.0 to 36.9 in adult, unspecified obesity type (HCC)  Constipation, unspecified constipation type -     Ambulatory referral to Gastroenterology  Family history of colon cancer in mother -     Ambulatory referral to Gastroenterology    Problem List Items Addressed This Visit  Other   Constipation    Minimal improvement with miralax, high fiber diet and soothe move tea. Associated with ABD pain, bloating and straining during BM.  Entered referral to GI      Relevant Orders   Ambulatory referral to Gastroenterology   Family history of colon cancer in mother   Relevant Orders   Ambulatory referral to Gastroenterology   Obesity - Primary    Unable to tolerate phentermine due to severe constipation Avoid victoza or saxenda or ozempic at this time due to constipation. She did not find appt  with nutritionist to be benficial.  Continue heart healthy diet, small portions and regular exercise at this time.         Follow-up: No follow-ups on file.  Wilfred Lacy, NP

## 2020-04-05 NOTE — Assessment & Plan Note (Signed)
Referred to GYN Use of lysteda at this time

## 2020-04-23 ENCOUNTER — Encounter: Payer: Self-pay | Admitting: Gastroenterology

## 2020-04-23 ENCOUNTER — Telehealth: Payer: Self-pay | Admitting: Gastroenterology

## 2020-04-23 ENCOUNTER — Ambulatory Visit: Payer: BC Managed Care – PPO | Admitting: Gastroenterology

## 2020-04-23 VITALS — BP 118/84 | HR 102 | Ht 66.0 in | Wt 229.0 lb

## 2020-04-23 DIAGNOSIS — Z1211 Encounter for screening for malignant neoplasm of colon: Secondary | ICD-10-CM

## 2020-04-23 DIAGNOSIS — K5909 Other constipation: Secondary | ICD-10-CM | POA: Diagnosis not present

## 2020-04-23 MED ORDER — SUTAB 1479-225-188 MG PO TABS: 1.0000 | ORAL_TABLET | Freq: Once | ORAL | 0 refills | Status: AC

## 2020-04-23 NOTE — Progress Notes (Signed)
HPI :  50 year old female with a history of well-controlled asthma, referred here for new patient evaluation by Wilfred Lacy NP for chronic constipation.  Patient states since she has been in adulthood, at least since age 38s, she has had bowel habits that are not as regular as they used to be.  Over time that has gotten worse and now she can have sniffing and constipation.  For the past 5 years she has been dependent on MiraLAX to take daily to make her move her bowels.  She tried changing the regimen to Amitiza in early December, this initially worked for a bowel movement and then she went over a week or so without having a bowel movement.  This led to significant abdominal discomfort.  She took MiraLAX, milk of magnesia, and enemas on top of this and led to an urgent care visit where they gave her Dulcolax.  Finally after combination of these things she had a bowel movement.  Since that time she has been maintaining on MiraLAX every day for the most part and that will actually allow her to have a bowel movement most days of the week.  She may not have a bowel movement about 2 days in a typical week.  Stool has been mostly formed and does not have much straining if she takes this.  She denies any blood in her stools.  No abdominal pains.  No nausea or vomiting.  She also uses activity most days for the past 2 years.  She is otherwise healthy, asthma is controlled.  Denies any family history of IBD.  Her maternal grandfather had colon cancer diagnosed in his 108s.  No other first-degree family history of colon cancer.  She is never had a prior colonoscopy.   Past Medical History:  Diagnosis Date  . Asthma      Past Surgical History:  Procedure Laterality Date  . CYST REMOVAL TRUNK    . HYSTERECTOMY ABDOMINAL WITH SALPINGECTOMY Bilateral 07/19/2019   Procedure: HYSTERECTOMY ABDOMINAL WITH SALPINGECTOMY;  Surgeon: Princess Bruins, MD;  Location: [facility];  Service:  Gynecology;  Laterality: Bilateral;  request 7:30am OR time in [place] [facility] block requests 1 1/2 hours  . MOUTH SURGERY     Family History  Problem Relation Age of Onset  . Hypertension Mother   . Other Mother        [name]  . Hypertension Father   . Cancer Paternal Grandfather        lung cancer due to tobacco use  . Cancer Maternal Grandmother        colon-   Social History   Tobacco Use  . Smoking status: Never Smoker  . Smokeless tobacco: Never Used  Vaping Use  . Vaping Use: Never used  Substance Use Topics  . Alcohol use: Yes    Comment: SELDOM  . Drug use: Never   Current Outpatient Medications  Medication Sig Dispense Refill  . polyethylene glycol (MIRALAX / GLYCOLAX) 17 g packet Take 17 g by mouth daily. 1 capful daily    . Sodium Sulfate-Mag Sulfate-KCl (SUTAB) [phone] MG TABS Take 1 kit by mouth once for 1 dose. Marland KitchenMANUFACTURER CODES!! BIN: [id number] PCN: CN GROUP: [id number] MEMBER ID: [id number];ZDG AS SECONDARY INSURANCE ;NO PRIOR AUTHORIZATION 24 tablet 0   No current facility-administered medications for this visit.   No Known Allergies   Review of Systems: All systems reviewed and negative except where noted in HPI.   Lab Results  Component Value  Date   WBC 3.8 (L) 02/15/2020   HGB 13.4 02/15/2020   HCT 41.0 02/15/2020   MCV 94.9 02/15/2020   PLT 328.0 02/15/2020    Lab Results  Component Value Date   CREATININE 0.65 02/15/2020   BUN 15 02/15/2020   NA 139 02/15/2020   K 4.2 02/15/2020   CL 107 02/15/2020   CO2 27 02/15/2020    Lab Results  Component Value Date   ALT 28 02/15/2020   AST 27 02/15/2020   ALKPHOS 80 02/15/2020   BILITOT 0.5 02/15/2020     Physical Exam: BP 118/84 (BP Location: Left Arm, Patient Position: Sitting)   Pulse (!) 102   Ht _0  (1.676 m)   Wt 229 lb (103.9 kg)   LMP 05/25/2019   SpO2 96%   BMI 36.96 kg/m  Constitutional: Pleasant,well-developed, female in no acute distress. HEENT:  Normocephalic and atraumatic. Conjunctivae are normal. No scleral icterus. Neck supple.  Cardiovascular: Normal rate, regular rhythm.  Pulmonary/chest: Effort normal and breath sounds normal. No wheezing, rales or rhonchi. Abdominal: Soft, nondistended, nontender. t. There are no masses palpable. DRE - CMA Julieanne Cotton as standby - perianal skin tag, normal DRE - no mass lesions, normal decent and squeeze Extremities: no edema Lymphadenopathy: No cervical adenopathy noted. Neurological: Alert and oriented to person place and time. Skin: Skin is warm and dry. No rashes noted. Psychiatric: Normal mood and affect. Behavior is normal.   ASSESSMENT AND PLAN: 50 year old female here for new patient assessment the following:  Chronic constipation - ongoing for years, she has required laxatives on a daily basis to move her bowels.  Recent worsening as above when she stopped MiraLAX and tried another regimen leading to urgent care visit.  I discussed constipation in general with her.  Her DRE is normal, no evidence of pelvic floor dysfunction, no mass lesion appreciated.  I suspect she likely has slow transit constipation we discussed what that is.  Currently using MiraLAX once daily and for the most part that works pretty well for most days of the week, occasionally goes without a bowel movement.  She questions whether this is safe for her long-term and we discussed options.  Given MiraLAX reliably works, I would continue that for now given its safe and cheaper compared to other alternatives.  She can increase the dose of MiraLAX to double dose or twice daily as needed or even more than that if she desires.  We discussed alternative regimens and wishes to stay with MiraLAX for now.  She is due for a colonoscopy as outlined below and will make sure otherwise no pathology going on to contribute to this but I reassured her that given the chronicity of her symptoms and labs that look okay that it is unlikely she  has anything concerning like colon cancer at this time.  She agreed.  Will await her course and results of colonoscopy.  Colon cancer screening - discussed options for screening with her to include colonoscopy and stool based testing.  Given her symptoms as above optical colonoscopy is recommended.  I discussed colonoscopy and anesthesia with her, risks and benefits and she wants to proceed.  Further recommendations pending the results.  I recommend that she take a few extra doses of MiraLAX per day in the days preceding her bowel prep to make sure the bowel prep works okay.  She agreed  Hilliard Cellar, MD [facility]  CC: Lorayne Marek Charlene Brooke, NP

## 2020-04-23 NOTE — Patient Instructions (Addendum)
If you are age 49 or older, your body mass index should be between 23-30. Your Body mass index is 36.96 kg/m. If this is out of the aforementioned range listed, please consider follow up with your Primary Care Provider.  If you are age 75 or younger, your body mass index should be between 19-25. Your Body mass index is 36.96 kg/m. If this is out of the aformentioned range listed, please consider follow up with your Primary Care Provider.   You have been scheduled for a colonoscopy. Please follow written instructions given to you at your visit today.  Please pick up your prep supplies at the pharmacy within the next 1-3 days. If you use inhalers (even only as needed), please bring them with you on the day of your procedure.   Please purchase the following medications over the counter and take as directed: Miralax: Take as directed, once to twice a day as needed    Thank you for entrusting me with your care and for choosing [facility], Dr. [place] Cellar

## 2020-04-23 NOTE — Telephone Encounter (Signed)
refaxed codes to pharmacy.  MANUFACTURER CODES!! BIN: [id number] PCN: CN GROUP: [id number] MEMBER ID: [id number];TUU AS SECONDARY INSURANCE ;NO PRIOR AUTHORIZATION

## 2020-04-23 NOTE — Telephone Encounter (Signed)
Patient called stating that prep for procedure needs PA. Please call patient. ° °

## 2020-04-24 NOTE — Telephone Encounter (Signed)
Called and spoke to pharmacy. They ran the codes and the price for the patient will be $50. They will inform the patient she can pick up the prep

## 2020-04-25 ENCOUNTER — Other Ambulatory Visit: Payer: Self-pay | Admitting: Obstetrics & Gynecology

## 2020-05-02 ENCOUNTER — Ambulatory Visit (AMBULATORY_SURGERY_CENTER): Payer: BC Managed Care – PPO | Admitting: Gastroenterology

## 2020-05-02 ENCOUNTER — Other Ambulatory Visit: Payer: Self-pay

## 2020-05-02 ENCOUNTER — Encounter: Payer: Self-pay | Admitting: Gastroenterology

## 2020-05-02 VITALS — BP 113/75 | HR 91 | Temp 97.5°F | Resp 18 | Ht 66.0 in | Wt 229.0 lb

## 2020-05-02 DIAGNOSIS — Z1211 Encounter for screening for malignant neoplasm of colon: Secondary | ICD-10-CM

## 2020-05-02 DIAGNOSIS — R194 Change in bowel habit: Secondary | ICD-10-CM

## 2020-05-02 MED ORDER — SODIUM CHLORIDE 0.9 % IV SOLN: 500.0000 mL | Freq: Once | INTRAVENOUS | Status: DC

## 2020-05-02 NOTE — Patient Instructions (Signed)
Continue taking MIRALAX. You may resume your current medications today. Await biopsy results. Please call if any questions or concerns.     YOU HAD AN ENDOSCOPIC PROCEDURE TODAY AT [facility]:   Refer to the procedure report that was given to you for any specific questions about what was found during the examination.  If the procedure report does not answer your questions, please call your gastroenterologist to clarify.  If you requested that your care partner not be given the details of your procedure findings, then the procedure report has been included in a sealed envelope for you to review at your convenience later.  YOU SHOULD EXPECT: Some feelings of bloating in the abdomen. Passage of more gas than usual.  Walking can help get rid of the air that was put into your GI tract during the procedure and reduce the bloating. If you had a lower endoscopy (such as a colonoscopy or flexible sigmoidoscopy) you may notice spotting of blood in your stool or on the toilet paper. If you underwent a bowel prep for your procedure, you may not have a normal bowel movement for a few days.  Please Note:  You might notice some irritation and congestion in your nose or some drainage.  This is from the oxygen used during your procedure.  There is no need for concern and it should clear up in a day or so.  SYMPTOMS TO REPORT IMMEDIATELY:   Following lower endoscopy (colonoscopy or flexible sigmoidoscopy):  Excessive amounts of blood in the stool  Significant tenderness or worsening of abdominal pains  Swelling of the abdomen that is new, acute  Fever of 100F or higher    For urgent or emergent issues, a gastroenterologist can be reached at any hour by calling [phone]. Do not use MyChart messaging for urgent concerns.    DIET:  We do recommend a small meal at first, but then you may proceed to your regular diet.  Drink plenty of fluids but you should avoid alcoholic beverages for  24 hours.  ACTIVITY:  You should plan to take it easy for the rest of today and you should NOT DRIVE or use heavy machinery until tomorrow (because of the sedation medicines used during the test).    FOLLOW UP: Our staff will call the number listed on your records 48-72 hours following your procedure to check on you and address any questions or concerns that you may have regarding the information given to you following your procedure. If we do not reach you, we will leave a message.  We will attempt to reach you two times.  During this call, we will ask if you have developed any symptoms of COVID 19. If you develop any symptoms (ie: fever, flu-like symptoms, shortness of breath, cough etc.) before then, please call [phone].  If you test positive for Covid 19 in the 2 weeks post procedure, please call and report this information to [place].    If any biopsies were taken you will be contacted by phone or by letter within the next 1-3 weeks.  Please call us at [phone] if you have not heard about the biopsies in 3 weeks.    SIGNATURES/CONFIDENTIALITY: You and/or your care partner have signed paperwork which will be entered into your electronic medical record.  These signatures attest to the fact that that the information above on your After Visit Summary has been reviewed and is understood.  Full responsibility of the confidentiality of this discharge  information lies with you and/or your care-partner.

## 2020-05-02 NOTE — Op Note (Signed)
[facility]le Patient Name: Linda Chaney Procedure Date: 05/02/2020 7:37 AM MRN: [id number] Endoscopist: Remo Lipps P. Havery Moros , MD Age: 50 Referring MD:  Date of Birth: 1970/05/13 Gender: Female Account #: [id number] Procedure:                Colonoscopy Indications:              Altered bowel habits / Constipation, Colon cancer                            screening, first time exam Medicines:                Monitored Anesthesia Care Procedure:                Pre-Anesthesia Assessment:                           - Prior to the procedure, a History and Physical                            was performed, and patient medications and                            allergies were reviewed. The patient's tolerance of                            previous anesthesia was also reviewed. The risks                            and benefits of the procedure and the sedation                            options and risks were discussed with the patient.                            All questions were answered, and informed consent                            was obtained. Prior Anticoagulants: The patient has                            taken no previous anticoagulant or antiplatelet                            agents. ASA Grade Assessment: II - A patient with                            mild systemic disease. After reviewing the risks                            and benefits, the patient was deemed in                            satisfactory condition to undergo the procedure.  After obtaining informed consent, the colonoscope                            was passed under direct vision. Throughout the                            procedure, the patient's blood pressure, pulse, and                            oxygen saturations were monitored continuously. The                            Olympus PCF-[id number] (#[id number]) Colonoscope was                            introduced through the anus and  advanced to the the                            terminal ileum, with identification of the                            appendiceal orifice and IC valve. The colonoscopy                            was performed without difficulty. The patient                            tolerated the procedure well. The quality of the                            bowel preparation was adequate. The terminal ileum,                            ileocecal valve, appendiceal orifice, and rectum                            were photographed. Scope In: 7:41:19 AM Scope Out: 8:07:56 AM Scope Withdrawal Time: 0 hours 23 minutes 58 seconds  Total Procedure Duration: 0 hours 26 minutes 37 seconds  Findings:                 The perianal and digital rectal examinations were                            normal other than skin tags.                           The terminal ileum appeared normal.                           A suspected anal papillae was hypertrophied /                            enlarged, arising from below the dentate line.  Biopsies were taken with a cold forceps for                            histology to rule out AIN / adenoma.                           The exam was otherwise without abnormality. Complications:            No immediate complications. Estimated blood loss:                            Minimal. Estimated Blood Loss:     Estimated blood loss was minimal. Impression:               - The examined portion of the ileum was normal.                           - A suspected anal papillae was hypertrophied.                            Biopsied.                           - The examination was otherwise normal. Recommendation:           - Patient has a contact number available for                            emergencies. The signs and symptoms of potential                            delayed complications were discussed with the                            patient. Return to normal  activities tomorrow.                            Written discharge instructions were provided to the                            patient.                           - Resume previous diet.                           - Continue present medications (Miralax)                           - Await pathology results with further                            recommendations. Remo Lipps P. Remmi Armenteros, MD 05/02/2020 8:15:59 AM This report has been signed electronically.

## 2020-05-02 NOTE — Progress Notes (Signed)
A/ox3, pleased with MAC, report to RN 

## 2020-05-02 NOTE — Progress Notes (Signed)
Called to room to assist during endoscopic procedure.  Patient ID and intended procedure confirmed with present staff. Received instructions for my participation in the procedure from the performing physician.  

## 2020-05-02 NOTE — Progress Notes (Signed)
No problems noted in the recovery room. maw 

## 2020-05-06 ENCOUNTER — Telehealth: Payer: Self-pay

## 2020-05-06 NOTE — Telephone Encounter (Signed)
LVM

## 2020-07-09 ENCOUNTER — Encounter: Payer: Self-pay | Admitting: Obstetrics & Gynecology

## 2020-10-25 IMAGING — MG DIGITAL SCREENING BILAT W/ TOMO W/ CAD
6 of 12 series · 6 of 36 positions shown · non-contrast
Comparison: None.

CLINICAL DATA: Screening.

EXAM:
DIGITAL SCREENING BILATERAL MAMMOGRAM WITH TOMO AND CAD

[R XCCL synth-2D]
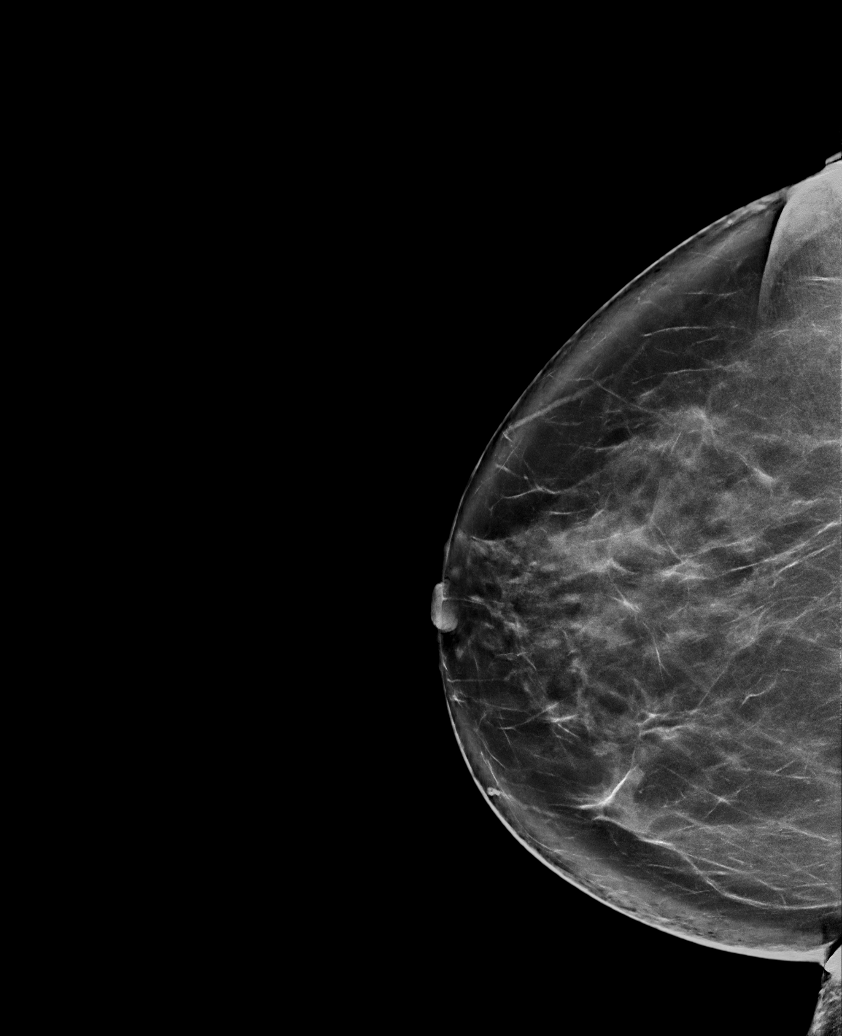

[L XCCL synth-2D]
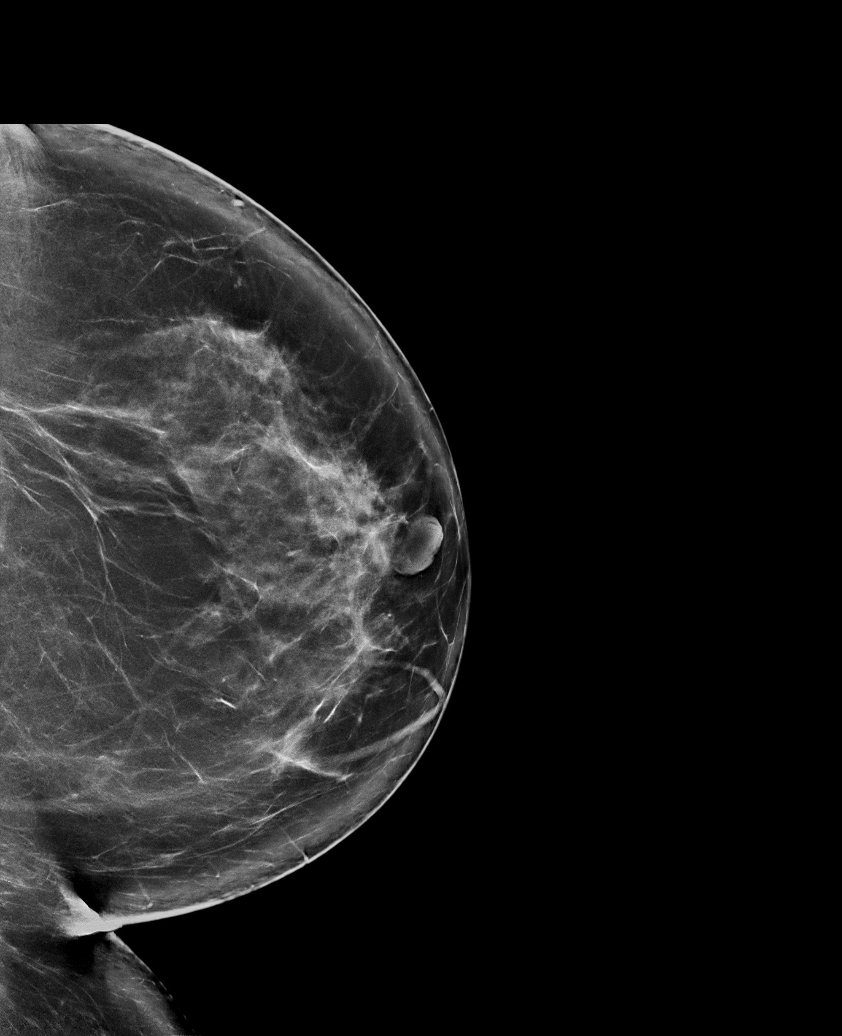

[R MLO synth-2D]
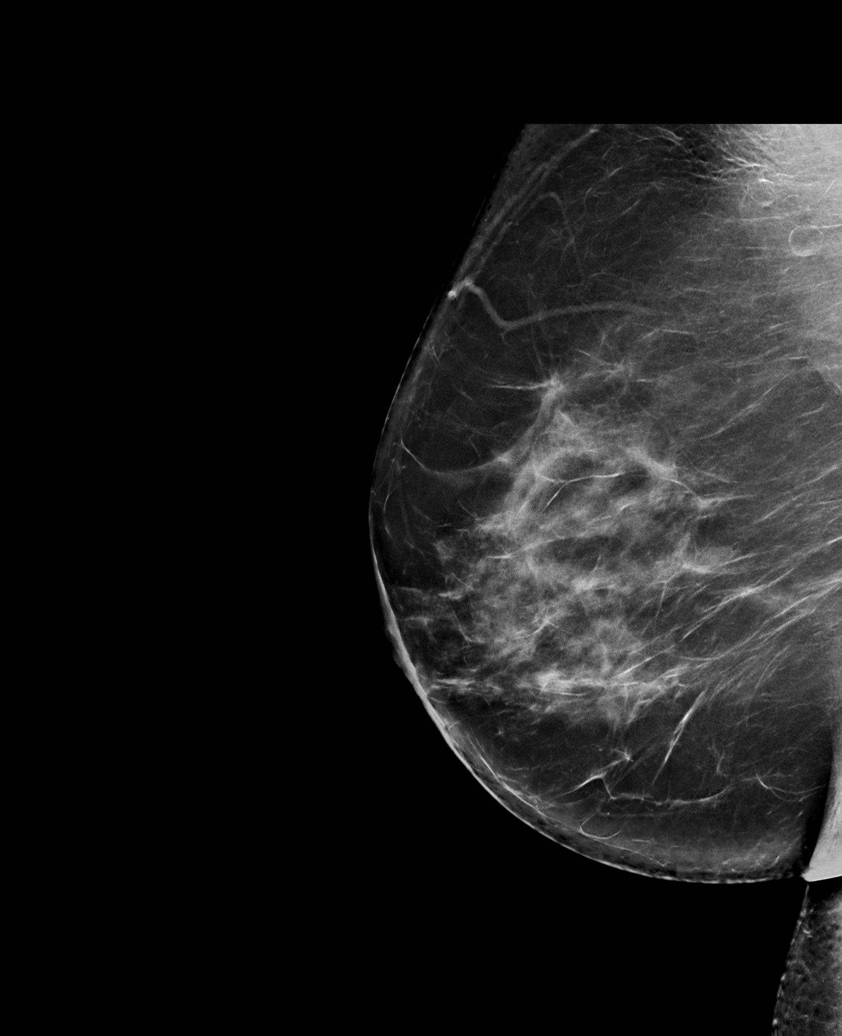

[L CC synth-2D]
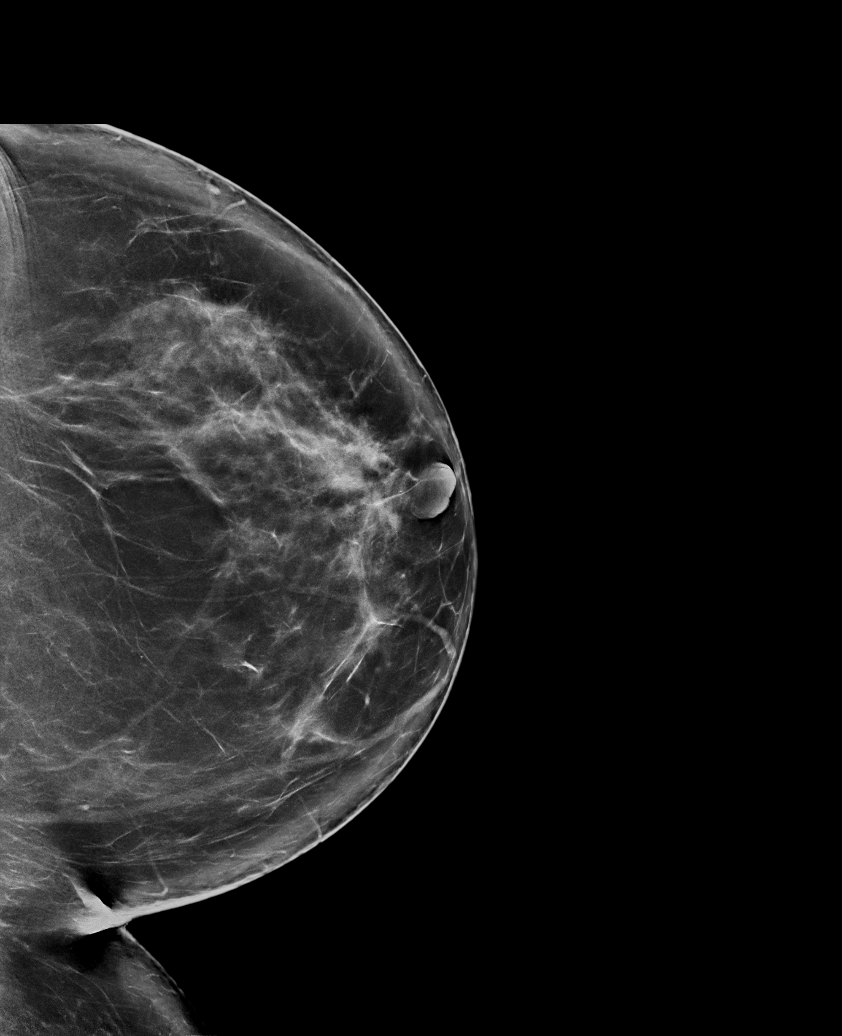

[R CC synth-2D]
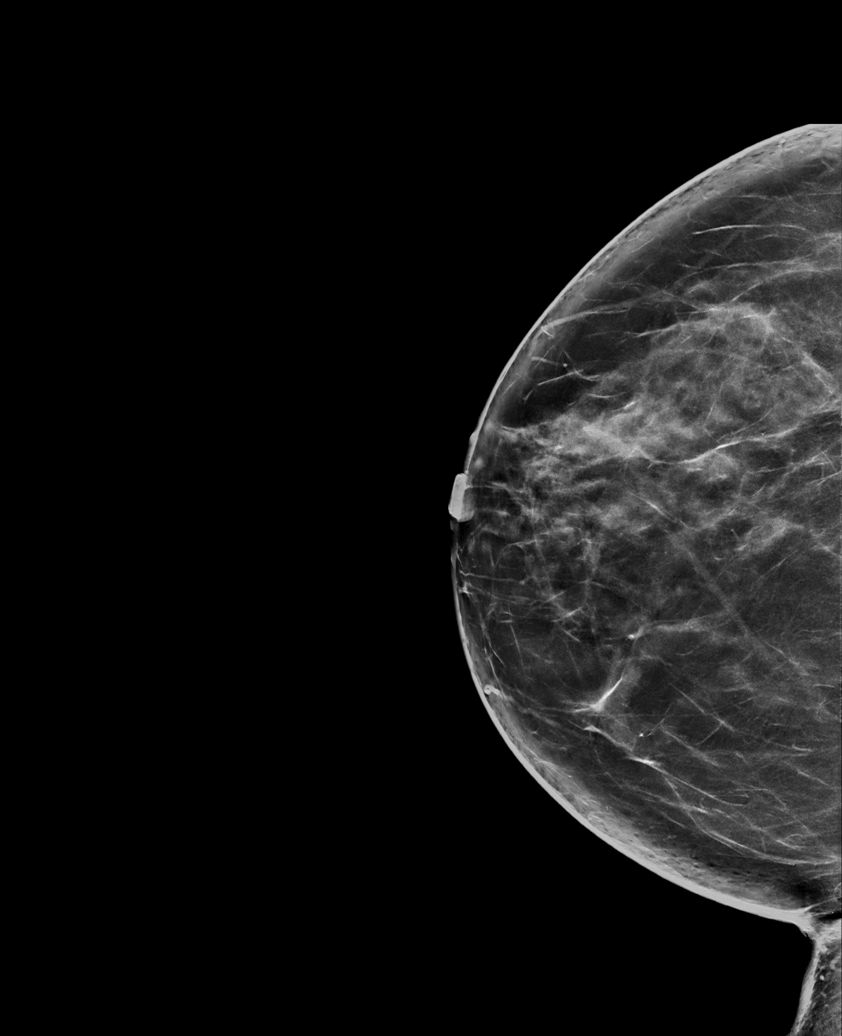

[L MLO synth-2D]
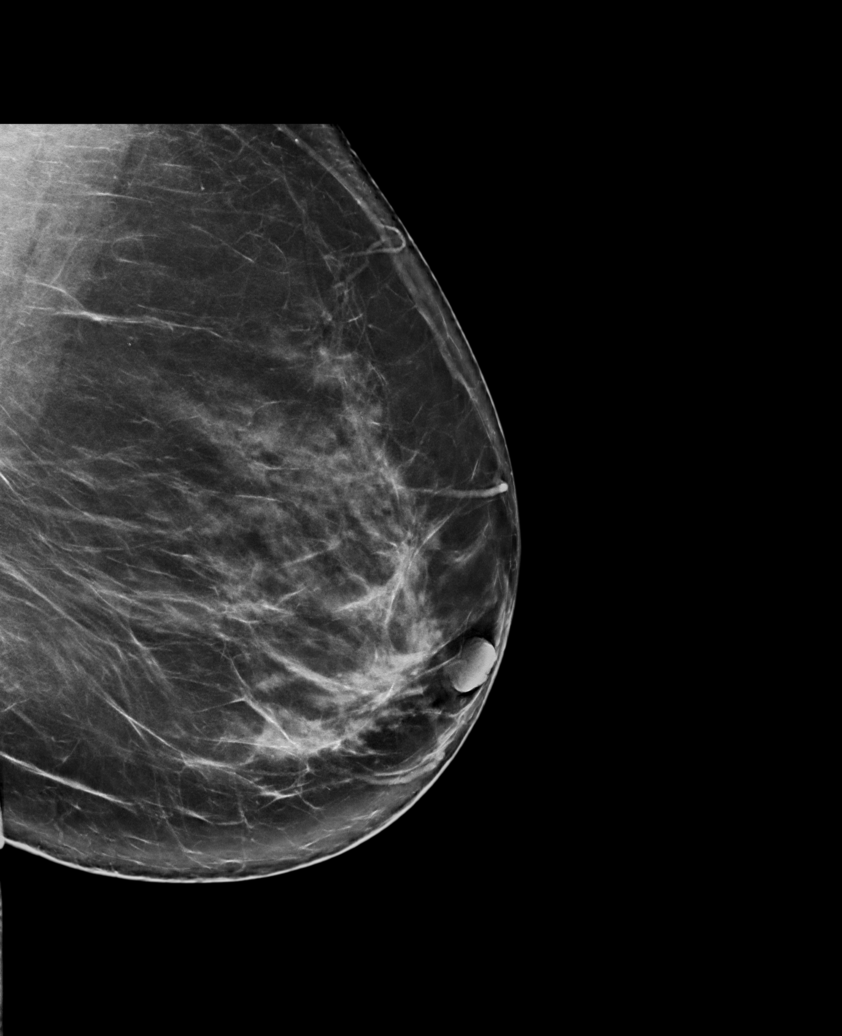

[6 of 36 positions shown; findings below may reference images not displayed]

ACR Breast Density Category c: The breast tissue is heterogeneously
dense, which may obscure small masses
FINDINGS: There are no findings suspicious for malignancy. Images were
processed with CAD.
IMPRESSION: No mammographic evidence of malignancy. A result letter of this
screening mammogram will be mailed directly to the patient.

RECOMMENDATION:
Screening mammogram in one year. (Code:EM-2-IHY)

BI-RADS CATEGORY  1: Negative.

## 2020-10-25 IMAGING — US US PELVIS COMPLETE WITH TRANSVAGINAL
1 series · 13 of 25 positions shown · non-contrast
Comparison: None

CLINICAL DATA: Irregular menstrual cycle, pelvic mass in a female

EXAM:
TRANSABDOMINAL AND TRANSVAGINAL ULTRASOUND OF PELVIS
TECHNIQUE: Both transabdominal and transvaginal ultrasound examinations of the
pelvis were performed. Transabdominal technique was performed for
global imaging of the pelvis including uterus, ovaries, adnexal
regions, and pelvic cul-de-sac. It was necessary to proceed with
endovaginal exam following the transabdominal exam to visualize the
endometrium and ovaries.

[Series 1: us pelvis complete with transvaginal · 13 of 137 slices shown]
[im 1/137]
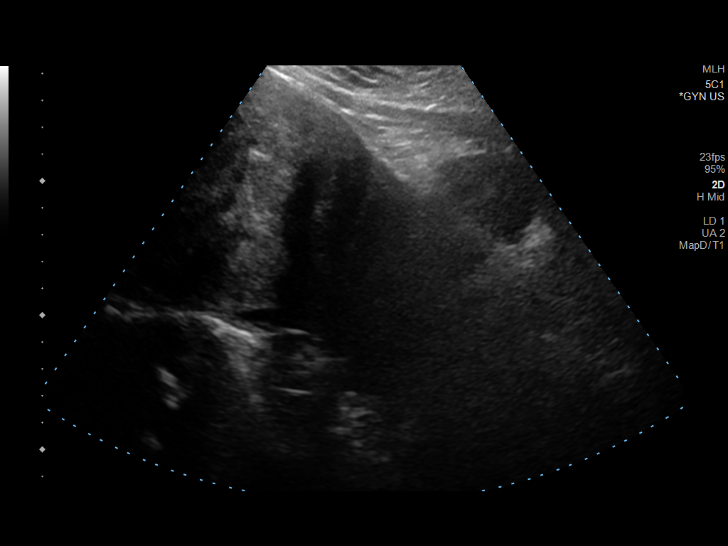
[im 12/137]
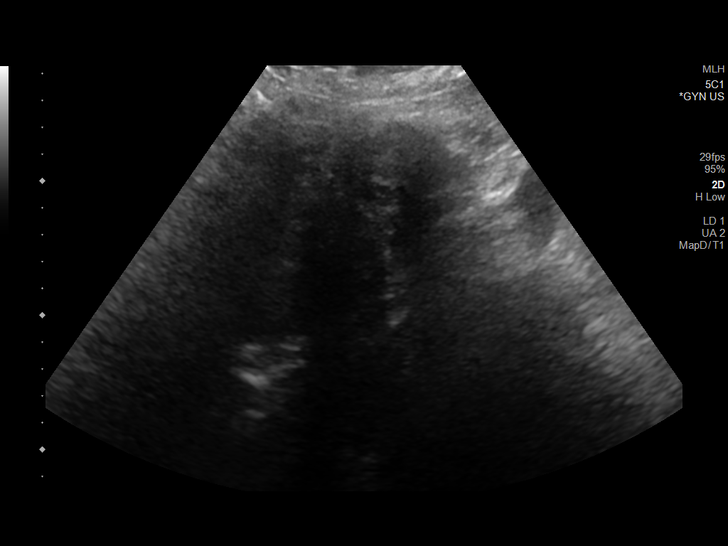
[im 23/137]
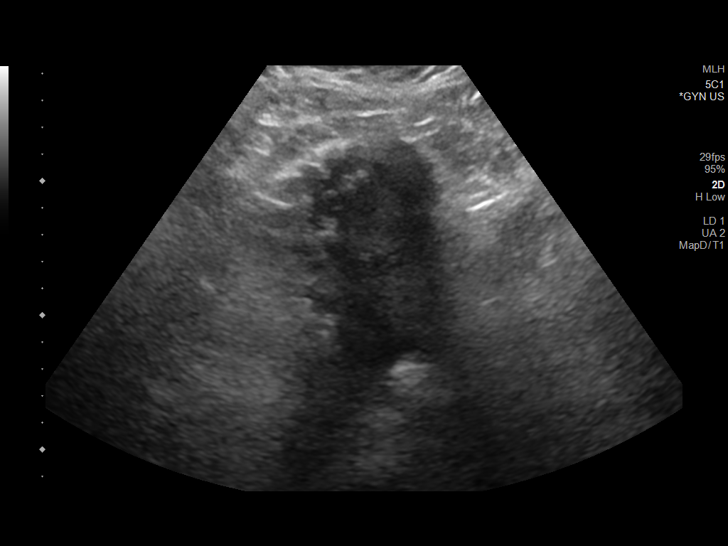
[im 35/137]
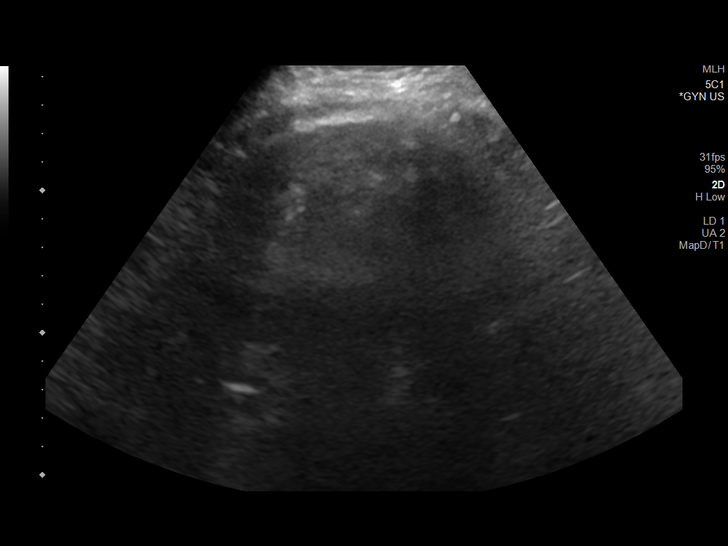
[im 46/137]
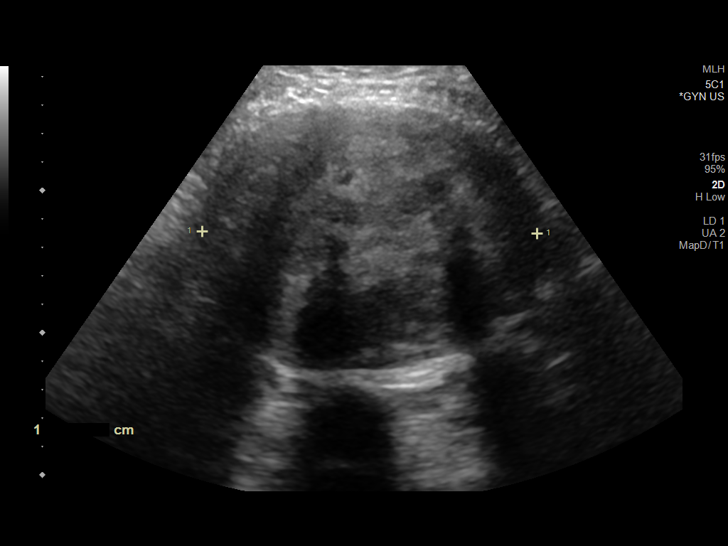
[im 57/137]
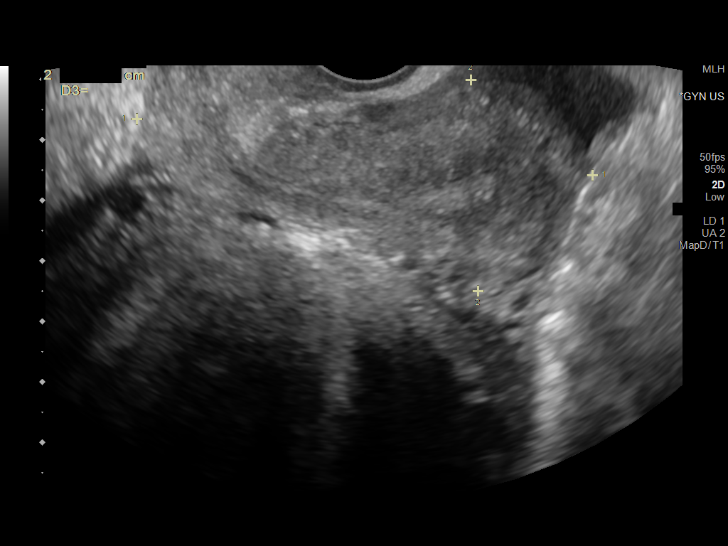
[im 69/137]
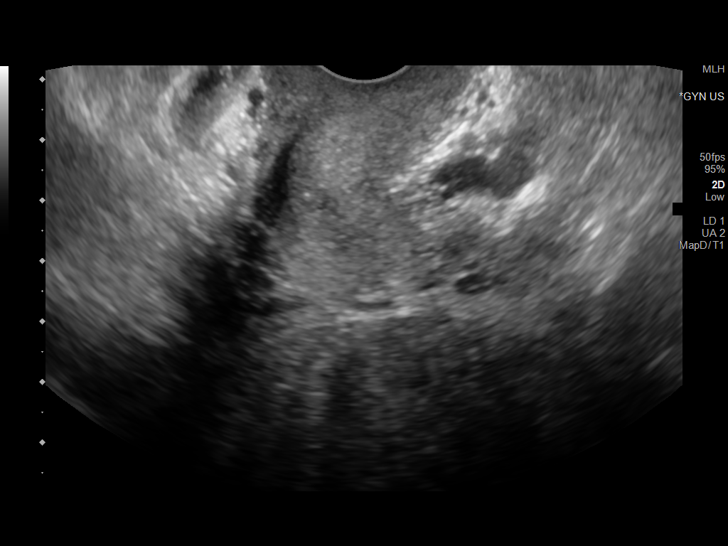
[im 80/137]
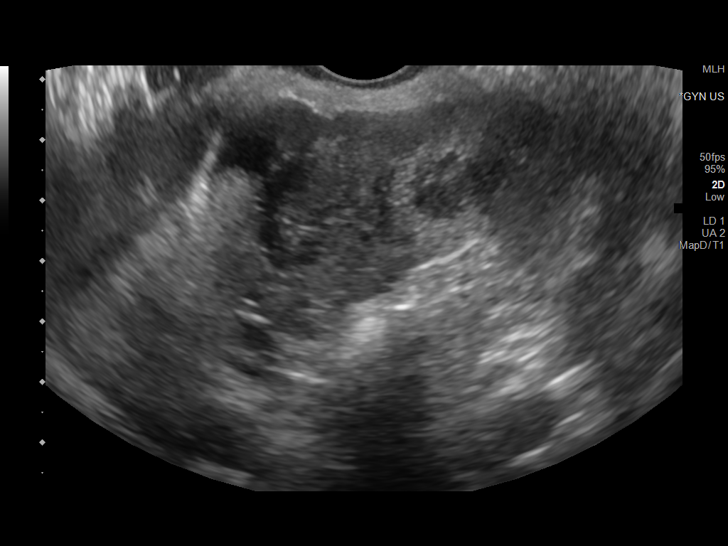
[im 91/137]
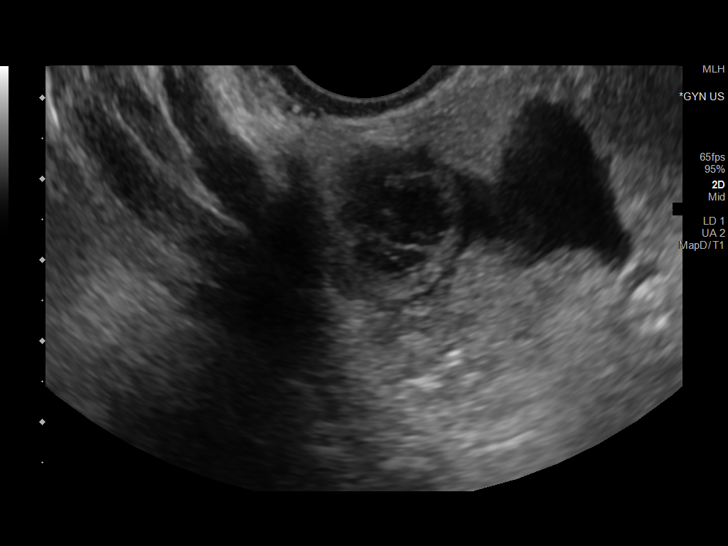
[im 103/137]
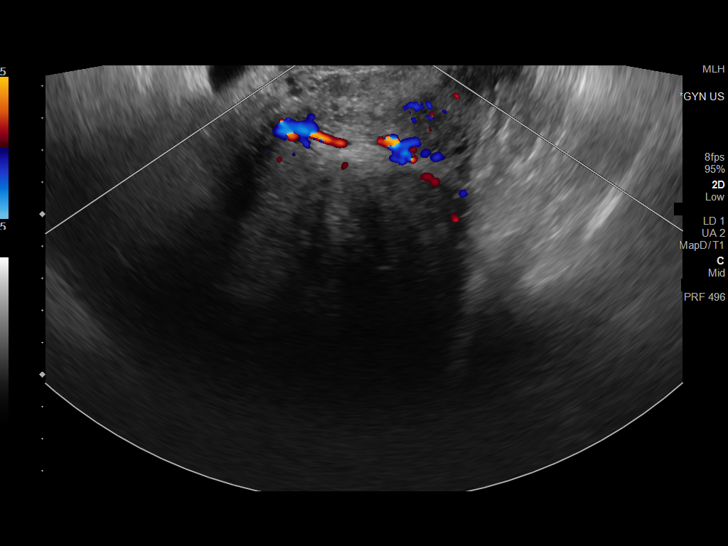
[im 114/137]
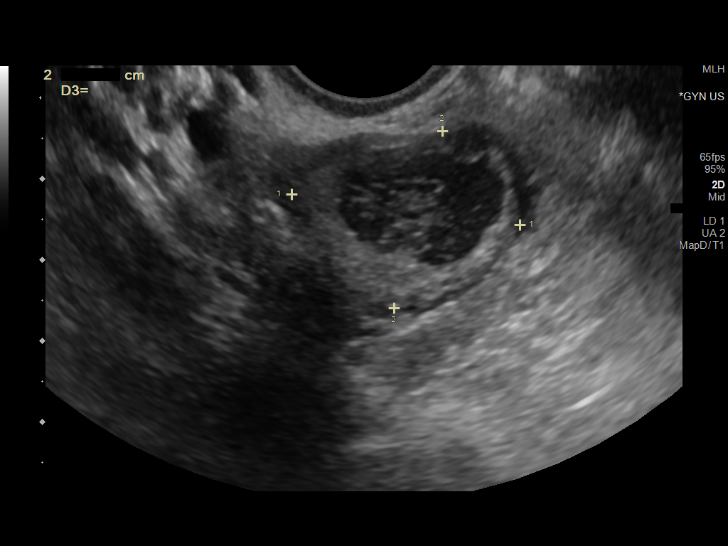
[im 125/137]
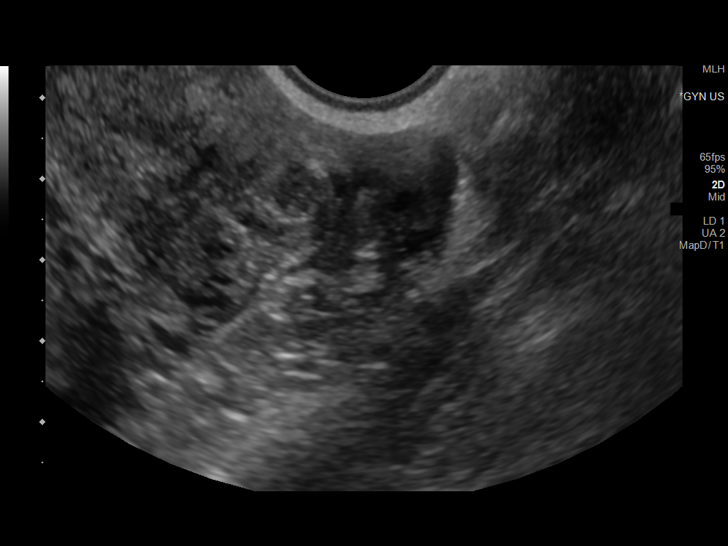
[im 137/137]
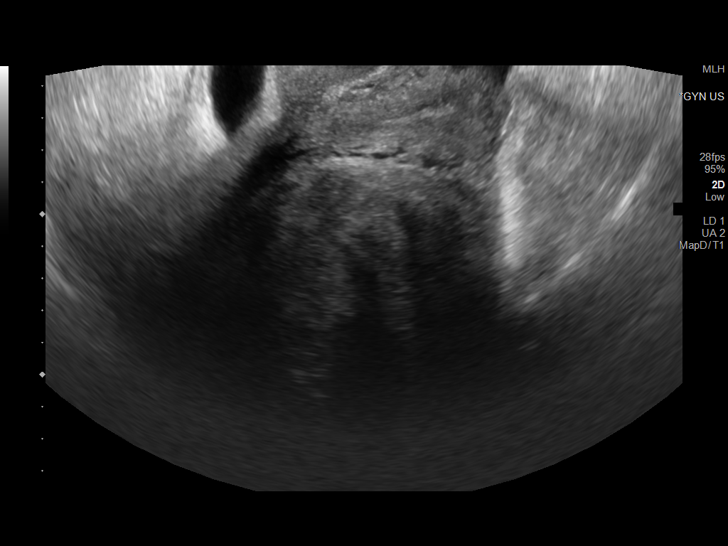

[13 of 25 positions shown; findings below may reference images not displayed]

FINDINGS: Uterus

Measurements: 7.6 x 3.5 x 5.1 cm = volume: 70 mL. Large soft tissue
mass identified anteriorly within the pelvis, adjacent to the upper
uterus, question large leiomyoma. This is suboptimally visualized
due to bowel gas, questionably measuring 19.6 x 10.1 x 13.7 cm. It
is confirm the origin of this mass as arising from the uterus.
Remainder of uterus is displaced posteriorly, retroflexed. No
additional masses.

Endometrium

Thickness: 6 mm.  No endometrial fluid or focal abnormality

Right ovary

Measurements: 2.8 x 2.3 x 2.1 cm = volume: 7 mL. Small hemorrhagic
cyst within RIGHT ovary 2.1 x 1.8 x 1.8 cm. No additional RIGHT
ovarian masses.

Left ovary

Measurements: 2.5 x 1.1 x 1.7 cm = volume: 3 mL. Normal morphology
without mass

Other findings

Small amount of free pelvic fluid.  No other adnexal masses.
IMPRESSION: Small hemorrhagic cyst within RIGHT ovary 2.1 cm diameter.

Large soft tissue mass anteriorly within the pelvis, adjacent to the
anterior uterine wall, question 19.6 x 10.1 x 13.7 cm.

Favor this representing a large exophytic uterine leiomyoma though
it is difficult to confirm origin from uterus; further
characterization of this lesion by MR imaging with and without
contrast recommended to exclude extra uterine masses and
leiomyosarcoma.

These results will be called to the ordering clinician or
representative by the Radiologist Assistant, and communication
documented in the PACS or zVision Dashboard.

## 2020-11-25 ENCOUNTER — Ambulatory Visit: Payer: BC Managed Care – PPO | Admitting: Nurse Practitioner

## 2020-11-26 ENCOUNTER — Encounter: Payer: Self-pay | Admitting: Nurse Practitioner

## 2020-11-26 ENCOUNTER — Ambulatory Visit: Payer: BC Managed Care – PPO | Admitting: Nurse Practitioner

## 2020-11-26 ENCOUNTER — Other Ambulatory Visit: Payer: Self-pay

## 2020-11-26 VITALS — BP 112/80 | HR 90 | Temp 99.0°F | Wt 225.0 lb

## 2020-11-26 DIAGNOSIS — S99922A Unspecified injury of left foot, initial encounter: Secondary | ICD-10-CM | POA: Diagnosis not present

## 2020-11-26 NOTE — Progress Notes (Signed)
   Subjective:  Patient ID: Linda Chaney, female    DOB: [birth year]  Age: 50 y.o. MRN: [id number]  CC: Acute Visit (Pt would like a referral to ortho due to a recent fall that injured her left leg/foot.)  Foot Injury  The incident occurred more than 1 week ago. The incident occurred at work. The injury mechanism was a twisting injury. The pain is present in the left foot. The quality of the pain is described as aching. Associated symptoms include an inability to bear weight. Pertinent negatives include no loss of motion, loss of sensation, muscle weakness, numbness or tingling. She reports no foreign bodies present. The symptoms are aggravated by weight bearing and palpation. She has tried immobilization, ice and elevation for the symptoms. The treatment provided significant relief.  Reviewed records from urgent care visit on 11/14/2020: ankle and foot x-ray indicated soft tissue swelling and negative for fracture. Today she reports improve pain, swelling and weight bearing with use of Cam boot  Reviewed past Medical, Social and Family history today.  Outpatient Medications Prior to Visit  Medication Sig Dispense Refill   clindamycin (CLEOCIN) 150 MG capsule Take by mouth. (Patient not taking: No sig reported)     No facility-administered medications prior to visit.    ROS See HPI  Objective:  BP 112/80 (BP Location: Left Arm, Patient Position: Sitting, Cuff Size: Normal)   Pulse 90   Temp 99 F (37.2 C) (Temporal)   Wt 225 lb (102.1 kg)   LMP 05/25/2019   SpO2 98%   BMI 36.32 kg/m   Physical Exam Musculoskeletal:     Right lower leg: Normal. No edema.     Left lower leg: Normal. No edema.     Right ankle: Normal.     Right Achilles Tendon: Normal.     Left ankle: Normal.     Left Achilles Tendon: Normal.     Left foot: Normal range of motion and normal capillary refill. Tenderness and bony tenderness present. No swelling, prominent metatarsal heads or crepitus. Normal pulse.      Comments: Tenderness over 5th metatarsal. Able to stand toes.  Neurological:     Mental Status: She is alert.    Assessment & Plan:  This visit occurred during the SARS-CoV-2 public health emergency.  Safety protocols were in place, including screening questions prior to the visit, additional usage of staff PPE, and extensive cleaning of exam room while observing appropriate contact time as indicated for disinfecting solutions.   Linda Chaney was seen today for acute visit.  Diagnoses and all orders for this visit:  Foot injury, left, initial encounter   Problem List Items Addressed This Visit   None Visit Diagnoses     Foot injury, left, initial encounter    -  Primary       Continue to use boot for another 2weeks. Remove at bedtime If unable to bear weight completely on foot in another 2weeks, will repeat foot x-ray and reconsider referral to podiatry.  Follow-up: No follow-ups on file.  Wilfred Lacy, NP

## 2020-11-26 NOTE — Patient Instructions (Addendum)
Continue to use boot for another 2weeks. Remove at bedtime If unable to bear weight on foot in another 2weeks, will repeat foot x-ray and reconsider referral to podiatry.  Foot Pain Many things can cause foot pain. Some common causes are: An injury. A sprain. Arthritis. Blisters. Bunions. Follow these instructions at home: Managing pain, stiffness, and swelling If directed, put ice on the painful area: Put ice in a plastic bag. Place a towel between your skin and the bag. Leave the ice on for 20 minutes, 2-3 times a day.  Activity Do not stand or walk for long periods. Return to your normal activities as told by your health care provider. Ask your health care provider what activities are safe for you. Do stretches to relieve foot pain and stiffness as told by your health care provider. Do not lift anything that is heavier than 10 lb (4.5 kg), or the limit that you are told, until your health care provider says that it is safe. Lifting a lot of weight can put added pressure on your feet. Lifestyle Wear comfortable, supportive shoes that fit you well. Do not wear high heels. Keep your feet clean and dry. General instructions Take over-the-counter and prescription medicines only as told by your health care provider. Rub your foot gently. Pay attention to any changes in your symptoms. Keep all follow-up visits as told by your health care provider. This is important. Contact a health care provider if: Your pain does not get better after a few days of self-care. Your pain gets worse. You cannot stand on your foot. Get help right away if: Your foot is numb or tingling. Your foot or toes are swollen. Your foot or toes turn white or blue. You have warmth and redness along your foot. Summary Common causes of foot pain are injury, sprain, arthritis, blisters or bunions. Ice, medicines, and comfortable shoes may help foot pain. Contact your health care provider if your pain does not  get better after a few days of self-care. This information is not intended to replace advice given to you by your health care provider. Make sure you discuss any questions you have with your healthcare provider. Document Revised: 12/30/2017 Document Reviewed: 12/30/2017 Elsevier Patient Education  [place].

## 2020-12-24 ENCOUNTER — Ambulatory Visit: Payer: BC Managed Care – PPO | Admitting: Nurse Practitioner

## 2020-12-24 ENCOUNTER — Ambulatory Visit (INDEPENDENT_AMBULATORY_CARE_PROVIDER_SITE_OTHER): Payer: BC Managed Care – PPO

## 2020-12-24 ENCOUNTER — Other Ambulatory Visit: Payer: Self-pay

## 2020-12-24 ENCOUNTER — Encounter: Payer: Self-pay | Admitting: Nurse Practitioner

## 2020-12-24 VITALS — BP 110/70 | HR 88 | Temp 97.3°F | Wt 227.4 lb

## 2020-12-24 DIAGNOSIS — W19XXXD Unspecified fall, subsequent encounter: Secondary | ICD-10-CM

## 2020-12-24 DIAGNOSIS — M25561 Pain in right knee: Secondary | ICD-10-CM

## 2020-12-24 MED ORDER — DICLOFENAC SODIUM 1 % EX GEL: 2.0000 g | Freq: Four times a day (QID) | CUTANEOUS | Status: DC

## 2020-12-24 NOTE — Patient Instructions (Addendum)
Go to lab for x-ray. May consider outpatient PT if knee x-ray if normal.  Ok to use Voltaren gel on knee joint as directed on package.

## 2020-12-24 NOTE — Progress Notes (Signed)
Subjective:  Patient ID: Linda Chaney, female    DOB: [birth year]  Age: 50 y.o. MRN: [id number]  CC: Acute Visit (Pt c/o knee feeling heavy and sore. )   Knee Pain  The incident occurred more than 1 week ago. The incident occurred at work. The injury mechanism was a twisting injury and a fall. The pain is present in the right knee. The quality of the pain is described as aching. The pain has been Constant since onset. Pertinent negatives include no inability to bear weight, loss of motion, loss of sensation, muscle weakness, numbness or tingling. She reports no foreign bodies present. The symptoms are aggravated by movement and weight bearing. She has tried immobilization for the symptoms. The treatment provided significant relief.  Had fall 4weeks ago at work event. Persistent knee pain since fall. No back pain, no hip pain. Left foot pain has resolved.  BP Readings from Last 3 Encounters:  12/24/20 110/70  11/26/20 112/80  05/02/20 113/75    Wt Readings from Last 3 Encounters:  12/24/20 227 lb 6.4 oz (103.1 kg)  11/26/20 225 lb (102.1 kg)  05/02/20 229 lb (103.9 kg)     Reviewed past Medical, Social and Family history today.  Outpatient Medications Prior to Visit  Medication Sig Dispense Refill   clindamycin (CLEOCIN) 150 MG capsule Take by mouth.     spironolactone (ALDACTONE) 50 MG tablet Take 50 mg by mouth daily.     No facility-administered medications prior to visit.   ROS See HPI  Objective:  BP 110/70 (BP Location: Left Arm, Patient Position: Sitting, Cuff Size: Large)   Pulse 88   Temp (!) 97.3 F (36.3 C) (Temporal)   Wt 227 lb 6.4 oz (103.1 kg)   LMP 05/25/2019   SpO2 99%   BMI 36.70 kg/m   Physical Exam Vitals reviewed.  Musculoskeletal:        General: Swelling and tenderness present. No deformity.     Lumbar back: Normal.     Right hip: Normal.     Left hip: Normal.     Right upper leg: Normal.     Left upper leg: Normal.     Right knee: Swelling  and crepitus present. No effusion, erythema or bony tenderness. Normal range of motion. Tenderness present over the medial joint line. No lateral joint line or patellar tendon tenderness.     Instability Tests: Anterior drawer test negative. Posterior drawer test negative.     Left knee: Normal.     Right lower leg: Normal. No edema.     Left lower leg: Normal. No edema.  Neurological:     Mental Status: She is alert and oriented to person, place, and time.   Assessment & Plan:  This visit occurred during the SARS-CoV-2 public health emergency.  Safety protocols were in place, including screening questions prior to the visit, additional usage of staff PPE, and extensive cleaning of exam room while observing appropriate contact time as indicated for disinfecting solutions.   Kitrina was seen today for acute visit.  Diagnoses and all orders for this visit:  Right knee pain, unspecified chronicity -     DG Knee Complete 4 Views Right -     diclofenac Sodium (VOLTAREN) 1 % GEL; Apply 2 g topically 4 (four) times daily.  Fall, subsequent encounter -     DG Knee Complete 4 Views Right  Problem List Items Addressed This Visit   None Visit Diagnoses     Right knee  pain, unspecified chronicity    -  Primary   Relevant Medications   diclofenac Sodium (VOLTAREN) 1 % GEL   Other Relevant Orders   DG Knee Complete 4 Views Right   Fall, subsequent encounter       Relevant Orders   DG Knee Complete 4 Views Right       Follow-up: No follow-ups on file.  Wilfred Lacy, NP

## 2020-12-26 ENCOUNTER — Encounter: Payer: Self-pay | Admitting: Nurse Practitioner

## 2020-12-26 NOTE — Telephone Encounter (Signed)
Spoke to patient. All issues resolved. Sw, cma

## 2020-12-27 NOTE — Addendum Note (Signed)
Addended by: Wilfred Lacy L on: 12/27/2020 01:22 PM   Modules accepted: Orders

## 2021-01-02 ENCOUNTER — Ambulatory Visit: Payer: BC Managed Care – PPO | Admitting: Nurse Practitioner

## 2021-01-14 ENCOUNTER — Other Ambulatory Visit: Payer: Self-pay

## 2021-01-14 ENCOUNTER — Ambulatory Visit: Payer: BC Managed Care – PPO | Attending: Nurse Practitioner | Admitting: Physical Therapy

## 2021-01-14 DIAGNOSIS — M25561 Pain in right knee: Secondary | ICD-10-CM | POA: Diagnosis not present

## 2021-01-14 DIAGNOSIS — M6281 Muscle weakness (generalized): Secondary | ICD-10-CM | POA: Diagnosis present

## 2021-01-14 DIAGNOSIS — R2689 Other abnormalities of gait and mobility: Secondary | ICD-10-CM | POA: Insufficient documentation

## 2021-01-14 NOTE — Therapy (Signed)
[place]. [place], [place], [address] Phone: [phone]   Fax:  [phone]  Physical Therapy Evaluation  Patient Details  Name: Linda Chaney MRN: [id number] Date of Birth: 1971/02/04 Referring Provider (PT): Nche, Charlene Brooke, NP   Encounter Date: 01/14/2021   PT End of Session - 01/14/21 1620     Visit Number 1    Number of Visits 7    Date for PT Re-Evaluation 02/25/21    Authorization Type BCBS    PT Start Time 1620    PT Stop Time 1700    PT Time Calculation (min) 40 min    Activity Tolerance Patient tolerated treatment well    Behavior During Therapy WFL for tasks assessed/performed             Past Medical History:  Diagnosis Date   Asthma     Past Surgical History:  Procedure Laterality Date   CYST REMOVAL TRUNK     HYSTERECTOMY ABDOMINAL WITH SALPINGECTOMY Bilateral 07/19/2019   Procedure: HYSTERECTOMY ABDOMINAL WITH SALPINGECTOMY;  Surgeon: Princess Bruins, MD;  Location: [facility];  Service: Gynecology;  Laterality: Bilateral;  request 7:30am OR time in [place] [facility] block requests 1 1/2 hours   MOUTH SURGERY      There were no vitals filed for this visit.    Subjective Assessment - 01/14/21 1622     Subjective Pt reports fall on Nov 14, 2020. Pt states she was standing on a platform on top of a small flight of stairs when she stepped back, fell, bruised her ribs, and hurt her L foot & R knee. Pt was placed in boot for 4-5 weeks. L foot has improved since then. She is still unable to put pressure on her R knee (noticeable when getting on floor). Pt notes she is unable to bring her R LE over the bath tub for a shower. "feels heavy"    Limitations House hold activities;Lifting    How long can you sit comfortably? n/a    How long can you stand comfortably? no issues    How long can you walk comfortably? no issues    Currently in Pain? Yes    Pain Score 7     Pain  Location Knee    Pain Orientation Right    Pain Descriptors / Indicators Sore    Pain Type Acute pain    Pain Frequency Intermittent    Aggravating Factors  Direct pressure on knees; lifting R leg up and over into bathtub/shower    Pain Relieving Factors Sitting, rest, avoiding aggravating factors    Effect of Pain on Daily Activities Pain with shower transfers, bed mobility, activities requiring kneeling                [facility] PT Assessment - 01/14/21 0001       Assessment   Medical Diagnosis M25.561 (ICD-10-CM) - Right knee pain, unspecified chronicity    Referring Provider (PT) Nche, Charlene Brooke, NP    Onset Date/Surgical Date 11/14/20    Prior Therapy None      Precautions   Precautions None      Restrictions   Weight Bearing Restrictions No      Balance Screen   Has the patient fallen in the past 6 months Yes    How many times? 1    Has the patient had a decrease in activity level because of a fear of falling?  No  Is the patient reluctant to leave their home because of a fear of falling?  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    Right/Left Knee Right;Left    Right Knee Flexion 3+/5   Limited due to pain   Right Knee Extension 4+/5    Left Knee Flexion 5/5    Left Knee Extension 5/5      Flexibility   Soft Tissue Assessment /Muscle Length yes    Hamstrings R LE to ~70 deg; L LE >90 deg      Palpation   Patella mobility R appears slightly more hypermobile than L in lateral and medial  gliding    Palpation comment TTP along semitendinosus, gracilis & sartorius insertion, and pes anserinus      Special Tests    Special Tests Knee Special Tests    Other special tests FABER causes increased pain/pressure in medial R knee    Knee Special tests  Patellofemoral Grind Test (Clarke's Sign);other;other2      Patellofemoral Grind test (Clark's Sign)   Findings Negative      other    Findings Negative    Comments Valgus & varus test      other   findings --    Comments --                        Objective measurements completed on examination: See above findings.       [facility] Adult PT Treatment/Exercise - 01/14/21 0001       Exercises   Exercises Knee/Hip      Knee/Hip Exercises: Stretches   Passive Hamstring Stretch 30 seconds    Passive Hamstring Stretch Limitations seated    Other Knee/Hip Stretches Adductor stretch seated (attempted butterfly but increased pain/pressure in knee) x30 sec      Knee/Hip Exercises: Seated   Ball Squeeze 5x10 sec hold      Knee/Hip Exercises: Supine   Other Supine Knee/Hip Exercises Hamstring iso 5x10 sec holds                     PT Education - 01/14/21 2306     Education Details Exam findings, POC, HEP    Person(s) Educated Patient    Methods Explanation;Tactile cues;Verbal cues;Handout    Comprehension Verbalized understanding;Returned demonstration;Tactile cues required                 PT Long Term Goals - 01/14/21 2325       PT LONG TERM GOAL #1   Title Pt will be independent with HEP    Time 6    Period Weeks    Status New    Target Date 02/25/21      PT LONG TERM GOAL #2   Title Pt will be able to step over her tub/shower without pain    Time 6  Period Weeks    Status New    Target Date 02/25/21      PT LONG TERM GOAL #3   Title Pt will have no pain during R knee MMT and demo 5/5 strength    Time 6    Period Weeks    Status New    Target Date 02/25/21      PT  LONG TERM GOAL #4   Title Pt will have R = L hamstring length    Time 6    Period Weeks    Status New    Target Date 02/25/21      PT LONG TERM GOAL #5   Title Pt will report at least 50% decrease in knee pain    Time 6    Period Weeks    Status New    Target Date 02/25/21                    Plan - 01/14/21 2306     Clinical Impression Statement Ms. Linda Chaney is a 50 y/o F presenting to OPPT due to complaint of R knee pain s/p fall off steps. On assessment, she has increased pain with MMT of R hamstring with R>L hamstring tightness and tenderness to palpation along antero medial tibia (along site of pes anserine bursa, gracilis and semitendinosus tendon insertion). Pt demos s/s consistent with possible pes anserine bursitis with or without possible hamstring/adductor strain vs tendinitis. Pt would benefit from PT to improve her pain for return to baseline level.    Personal Factors and Comorbidities Age;Fitness;Time since onset of injury/illness/exacerbation    Examination-Activity Limitations Bathing;Bed Mobility;Hygiene/Grooming;Stairs    Examination-Participation Restrictions Cleaning;Community Activity    Stability/Clinical Decision Making Stable/Uncomplicated    Clinical Decision Making Low    Rehab Potential Good    PT Frequency 1x / week    PT Duration 6 weeks    PT Treatment/Interventions ADLs/Self Care Home Management;Cryotherapy;Electrical Stimulation;Iontophoresis 4mg /ml Dexamethasone;Moist Heat;Ultrasound;Stair training;Functional mobility training;Therapeutic activities;Therapeutic exercise;Balance training;Neuromuscular re-education;Patient/family education;Manual techniques;Passive range of motion;Taping;Dry needling;Vasopneumatic Device    PT Next Visit Plan Review HEP as indicated. Consider ultrasound, ionto or taping. Continue to progress hamstring and quad strengthening as tolerated.    PT Home Exercise Plan Access Code: [place]    Consulted and Agree with Plan  of Care Patient             Patient will benefit from skilled therapeutic intervention in order to improve the following deficits and impairments:  Decreased range of motion, Decreased strength, Pain, Decreased mobility, Impaired flexibility  Visit Diagnosis: Acute pain of right knee  Muscle weakness (generalized)  Other abnormalities of gait and mobility     Problem List Patient Active Problem List   Diagnosis Date Noted   Constipation 02/18/2020   Family history of colon cancer in mother 02/15/2020   Obesity 02/01/2019   Uterine fibroid 02/01/2019   Irregular menstrual cycle 02/01/2019   Hidradenitis suppurativa 01/25/2019   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 [place], [place], [address] Phone: [phone]   Fax:  [phone]  Name: Linda Chaney MRN: [id number] Date of Birth: 19-Apr-1970

## 2021-01-23 ENCOUNTER — Ambulatory Visit: Payer: BC Managed Care – PPO | Admitting: Physical Therapy

## 2021-01-30 ENCOUNTER — Ambulatory Visit: Payer: BC Managed Care – PPO | Admitting: Physical Therapy

## 2021-02-06 ENCOUNTER — Ambulatory Visit: Payer: BC Managed Care – PPO | Admitting: Physical Therapy

## 2021-02-11 ENCOUNTER — Ambulatory Visit: Payer: BC Managed Care – PPO | Attending: Nurse Practitioner | Admitting: Physical Therapy

## 2021-02-11 ENCOUNTER — Other Ambulatory Visit: Payer: Self-pay

## 2021-02-11 ENCOUNTER — Encounter: Payer: Self-pay | Admitting: Physical Therapy

## 2021-02-11 DIAGNOSIS — M6281 Muscle weakness (generalized): Secondary | ICD-10-CM | POA: Diagnosis present

## 2021-02-11 DIAGNOSIS — M25561 Pain in right knee: Secondary | ICD-10-CM | POA: Insufficient documentation

## 2021-02-11 DIAGNOSIS — R2689 Other abnormalities of gait and mobility: Secondary | ICD-10-CM | POA: Insufficient documentation
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
## 2021-02-13 ENCOUNTER — Ambulatory Visit: Payer: BC Managed Care – PPO | Admitting: Physical Therapy

## 2021-02-17 ENCOUNTER — Ambulatory Visit: Payer: BC Managed Care – PPO | Admitting: Physical Therapy

## 2021-02-17 ENCOUNTER — Encounter: Payer: Self-pay | Admitting: Physical Therapy

## 2021-02-17 ENCOUNTER — Other Ambulatory Visit: Payer: Self-pay

## 2021-02-17 DIAGNOSIS — M25561 Pain in right knee: Secondary | ICD-10-CM | POA: Diagnosis not present

## 2021-02-17 DIAGNOSIS — M6281 Muscle weakness (generalized): Secondary | ICD-10-CM

## 2021-02-17 DIAGNOSIS — R2689 Other abnormalities of gait and mobility: Secondary | ICD-10-CM

## 2021-02-17 NOTE — Therapy (Signed)
[place]. [place], [place], [address] Phone: [phone]   Fax:  [phone]  Physical Therapy Treatment  Patient Details  Name: Linda Linda MRN: [id number] Date of Birth: February 08, 1971 Referring Provider (PT): Nche, Charlene Brooke, NP   Encounter Date: 02/17/2021   PT End of Session - 02/17/21 1402     Visit Number 3    Number of Visits 7    Date for PT Re-Evaluation 02/25/21    Authorization Type BCBS    PT Start Time 1320    PT Stop Time 1359    PT Time Calculation (min) 39 min    Activity Tolerance Patient tolerated treatment well    Behavior During Therapy [facility] for tasks assessed/performed             Past Medical History:  Diagnosis Date   Asthma     Past Surgical History:  Procedure Laterality Date   CYST REMOVAL TRUNK     HYSTERECTOMY ABDOMINAL WITH SALPINGECTOMY Bilateral 07/19/2019   Procedure: HYSTERECTOMY ABDOMINAL WITH SALPINGECTOMY;  Surgeon: Princess Bruins, MD;  Location: [place];  Service: Gynecology;  Laterality: Bilateral;  request 7:30am OR time in [place] Gyn block requests 1 1/2 hours   MOUTH SURGERY      There were no vitals filed for this visit.   Subjective Assessment - 02/17/21 1322     Subjective I was here last week, I still feel like its difficult for me to lift my right leg, it feels heavy. No n/t going on, my left shoulder has been bothering me too. I just feel like the mobility in my right leg is limited still. Stairs vary, it depends on the shoes that I have on. Still hard to lift right leg up and over like getting in the tub.    Currently in Pain? No/denies                               [facility] Adult PT Treatment/Exercise - 02/17/21 0001       Knee/Hip Exercises: Standing   Heel Raises Both;1 set;15 reps    Lateral Step Up Right;15 reps    Lateral Step Up Limitations 4 inch step no UEs    Forward Step Up Right;1 set;15  reps    Forward Step Up Limitations 4 inch step no UEs      Knee/Hip Exercises: Supine   Bridges Both;1 set;15 reps    Bridges with Clamshell Both;1 set;15 reps   red TB   Other Supine Knee/Hip Exercises clams with red TB 1x15      Knee/Hip Exercises: Prone   Other Prone Exercises R hamstring curls 1x10      Manual Therapy   Manual Therapy Soft tissue mobilization    Soft tissue mobilization R distal adductor and medial HS groups                     PT Education - 02/17/21 1402     Education Details HEP udpates, potential POC moving forward, role of exercise/functional strength in improving symptoms    Person(s) Educated Patient    Methods Explanation;Handout    Comprehension Verbalized understanding;Returned demonstration                 PT Long Term Goals - 02/11/21 1755       PT LONG TERM GOAL #1   Title Pt will be  independent with HEP    Status Partially Met      PT LONG TERM GOAL #2   Title Pt will be able to step over her tub/shower without pain    Status Partially Met                   Plan - 02/17/21 1403     Clinical Impression Statement Ms. Haider arrives today reporting that her right leg slowly seems to be getting better but still just seems "heavy", can still be hard to lift up and over the edge of the tub. HEP is going OK- have been able to do them daily, "the more meetings I have the more exercises I tend to do" as HEP is currently sitting down. Spent today's session primarily working on strengthening as this is what her main complaints seem related to ("leg heaviness"). Did perform some STM to distal hip ADD groups, also noted some ongoing irritation patellar tendon, encouraged ice massage at home. Also updated HEP- will need reassess/recert next session if she wishes to continue.    Personal Factors and Comorbidities Age;Fitness;Time since onset of injury/illness/exacerbation    Examination-Activity Limitations Bathing;Bed  Mobility;Hygiene/Grooming;Stairs    Examination-Participation Restrictions Cleaning;Community Activity    Stability/Clinical Decision Making Stable/Uncomplicated    Clinical Decision Making Low    PT Frequency 1x / week    PT Duration 6 weeks    PT Treatment/Interventions ADLs/Self Care Home Management;Cryotherapy;Electrical Stimulation;Iontophoresis 35m/ml Dexamethasone;Moist Heat;Ultrasound;Stair training;Functional mobility training;Therapeutic activities;Therapeutic exercise;Balance training;Neuromuscular re-education;Patient/family education;Manual techniques;Passive range of motion;Taping;Dry needling;Vasopneumatic Device    PT Next Visit Plan reassess/recert    PT Home Exercise Plan Access Code: [place]   Consulted and Agree with Plan of Care Patient             Patient will benefit from skilled therapeutic intervention in order to improve the following deficits and impairments:  Decreased range of motion, Decreased strength, Pain, Decreased mobility, Impaired flexibility  Visit Diagnosis: Acute pain of right knee  Muscle weakness (generalized)  Other abnormalities of gait and mobility     Problem List Patient Active Problem List   Diagnosis Date Noted   Constipation 02/18/2020   Family history of colon cancer in mother 02/15/2020   Obesity 02/01/2019   Uterine fibroid 02/01/2019   Irregular menstrual cycle 02/01/2019   Hidradenitis suppurativa 01/25/2019   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

## 2021-02-27 ENCOUNTER — Ambulatory Visit: Payer: BC Managed Care – PPO | Admitting: Physical Therapy

## 2021-02-27 ENCOUNTER — Encounter: Payer: Self-pay | Admitting: Physical Therapy

## 2021-02-27 ENCOUNTER — Other Ambulatory Visit: Payer: Self-pay

## 2021-02-27 ENCOUNTER — Ambulatory Visit: Payer: BC Managed Care – PPO | Attending: Nurse Practitioner | Admitting: Physical Therapy

## 2021-02-27 DIAGNOSIS — M25561 Pain in right knee: Secondary | ICD-10-CM | POA: Insufficient documentation

## 2021-02-27 DIAGNOSIS — R2689 Other abnormalities of gait and mobility: Secondary | ICD-10-CM | POA: Diagnosis present

## 2021-02-27 DIAGNOSIS — M6281 Muscle weakness (generalized): Secondary | ICD-10-CM | POA: Insufficient documentation

## 2021-02-27 NOTE — Addendum Note (Signed)
Addended by: Hunt Oris on: 02/27/2021 12:27 PM   Modules accepted: Orders

## 2021-02-27 NOTE — Therapy (Addendum)
[place]. [place], [place], [address] Phone: [phone]   Fax:  [phone]  Physical Therapy Treatment  Patient Details  Name: Linda Chaney MRN: [id number] Date of Birth: Sep 18, 1970 Referring Provider (PT): Nche, Charlene Brooke, NP   Encounter Date: 02/27/2021   PT End of Session - 02/27/21 1214     Visit Number 4    Date for PT Re-Evaluation 02/25/21    PT Start Time 1143    PT Stop Time 1215    PT Time Calculation (min) 32 min    Activity Tolerance Patient tolerated treatment well    Behavior During Therapy [facility] for tasks assessed/performed             Past Medical History:  Diagnosis Date   Asthma     Past Surgical History:  Procedure Laterality Date   CYST REMOVAL TRUNK     HYSTERECTOMY ABDOMINAL WITH SALPINGECTOMY Bilateral 07/19/2019   Procedure: HYSTERECTOMY ABDOMINAL WITH SALPINGECTOMY;  Surgeon: Princess Bruins, MD;  Location: [place];  Service: Gynecology;  Laterality: Bilateral;  request 7:30am OR time in [place] Gyn block requests 1 1/2 hours   MOUTH SURGERY      There were no vitals filed for this visit.   Subjective Assessment - 02/27/21 1148     Subjective "its just one of those days" Reports a cramp in the R knee when putting on socks.    Currently in Pain? No/denies                [facility] PT Assessment - 02/27/21 0001       Strength   Right Knee Flexion 4-/5    Right Knee Extension 4+/5                           [facility] Adult PT Treatment/Exercise - 02/27/21 0001       Knee/Hip Exercises: Aerobic   Recumbent Bike level 2 x 5 minutes      Knee/Hip Exercises: Machines for Strengthening   Cybex Knee Extension 5# 2x10    Cybex Knee Flexion 20# 2x10    Cybex Leg Press 20# 2x10      Knee/Hip Exercises: Standing   Heel Raises Both;1 set;15 reps    Forward Step Up Both;1 set;10 reps;Hand Hold: 0;Step Height: 6"      Knee/Hip  Exercises: Seated   Sit to Sand 2 sets;10 reps             ROM R knee WFL             PT Long Term Goals - 02/27/21 1220       PT LONG TERM GOAL #3   Title Pt will have no pain during R knee MMT and demo 5/5 strength    Status Partially Met                   Plan - 02/27/21 1214     Clinical Impression Statement Shorted treatment time due to pt request. No issues completing today's interventions. She reports some tightness in the R knee but has full PROM. Pt has a slight improvement in R Hs strength but it remains weak.  Good effort with all strengthening interventions. Some strain reported with heel raises. Cue for LE placement needed with sit to stands. Cue to prevent slight knee valgus with sit to stands.    Personal Factors and Comorbidities Age;Fitness;Time since onset  of injury/illness/exacerbation    Examination-Activity Limitations Bathing;Bed Mobility;Hygiene/Grooming;Stairs    Examination-Participation Restrictions Cleaning;Community Activity    Stability/Clinical Decision Making Stable/Uncomplicated    Rehab Potential Good    PT Frequency 1x / week    PT Duration 6 weeks    PT Treatment/Interventions ADLs/Self Care Home Management;Cryotherapy;Electrical Stimulation;Iontophoresis 47m/ml Dexamethasone;Moist Heat;Ultrasound;Stair training;Functional mobility training;Therapeutic activities;Therapeutic exercise;Balance training;Neuromuscular re-education;Patient/family education;Manual techniques;Passive range of motion;Taping;Dry needling;Vasopneumatic Device    PT Next Visit Plan reassess/recert             Patient will benefit from skilled therapeutic intervention in order to improve the following deficits and impairments:  Decreased range of motion, Decreased strength, Pain, Decreased mobility, Impaired flexibility  Visit Diagnosis: Acute pain of right knee  Muscle weakness (generalized)  Other abnormalities of gait and  mobility     Problem List Patient Active Problem List   Diagnosis Date Noted   Constipation 02/18/2020   Family history of colon cancer in mother 02/15/2020   Obesity 02/01/2019   Uterine fibroid 02/01/2019   Irregular menstrual cycle 02/01/2019   Hidradenitis suppurativa 01/25/2019   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

## 2021-04-15 ENCOUNTER — Encounter: Payer: BC Managed Care – PPO | Admitting: Nurse Practitioner

## 2021-05-13 ENCOUNTER — Other Ambulatory Visit: Payer: Self-pay

## 2021-05-14 ENCOUNTER — Ambulatory Visit (INDEPENDENT_AMBULATORY_CARE_PROVIDER_SITE_OTHER): Payer: BC Managed Care – PPO | Admitting: Nurse Practitioner

## 2021-05-14 ENCOUNTER — Encounter: Payer: BC Managed Care – PPO | Admitting: Nurse Practitioner

## 2021-05-14 ENCOUNTER — Other Ambulatory Visit (HOSPITAL_COMMUNITY)
Admission: RE | Admit: 2021-05-14 | Discharge: 2021-05-14 | Disposition: A | Discharge status: Home / Self Care | Payer: BC Managed Care – PPO | Source: Ambulatory Visit | Attending: Nurse Practitioner | Admitting: Nurse Practitioner

## 2021-05-14 ENCOUNTER — Encounter: Payer: Self-pay | Admitting: Nurse Practitioner

## 2021-05-14 VITALS — BP 120/84 | HR 88 | Temp 97.4°F | Ht 66.25 in | Wt 226.2 lb

## 2021-05-14 DIAGNOSIS — Z124 Encounter for screening for malignant neoplasm of cervix: Secondary | ICD-10-CM

## 2021-05-14 DIAGNOSIS — R591 Generalized enlarged lymph nodes: Secondary | ICD-10-CM | POA: Diagnosis not present

## 2021-05-14 DIAGNOSIS — Z113 Encounter for screening for infections with a predominantly sexual mode of transmission: Secondary | ICD-10-CM | POA: Insufficient documentation

## 2021-05-14 DIAGNOSIS — Z1231 Encounter for screening mammogram for malignant neoplasm of breast: Secondary | ICD-10-CM

## 2021-05-14 DIAGNOSIS — Z13220 Encounter for screening for lipoid disorders: Secondary | ICD-10-CM | POA: Diagnosis not present

## 2021-05-14 DIAGNOSIS — E559 Vitamin D deficiency, unspecified: Secondary | ICD-10-CM | POA: Diagnosis not present

## 2021-05-14 DIAGNOSIS — Z0001 Encounter for general adult medical examination with abnormal findings: Secondary | ICD-10-CM | POA: Diagnosis not present

## 2021-05-14 DIAGNOSIS — N760 Acute vaginitis: Secondary | ICD-10-CM

## 2021-05-14 DIAGNOSIS — Z136 Encounter for screening for cardiovascular disorders: Secondary | ICD-10-CM

## 2021-05-14 DIAGNOSIS — B9689 Other specified bacterial agents as the cause of diseases classified elsewhere: Secondary | ICD-10-CM

## 2021-05-14 NOTE — Progress Notes (Signed)
Subjective:    Patient ID: Linda Chaney, female    DOB: 1970-12-03, 51 y.o.   MRN: [id number]  Patient presents today for CPE   Vision:up to date Dental:up to date Diet:heart healthy, portion Exercise:walking 15-1mins, 3x/week Weight:  Wt Readings from Last 3 Encounters:  05/14/21 226 lb 3.2 oz (102.6 kg)  12/24/20 227 lb 6.4 oz (103.1 kg)  11/26/20 225 lb (102.1 kg)    Sexual History (orientation,birth control, marital status, STD): requested for STD screen today, s/p hysterectomy, needs repeat meammogram  Depression/Suicide: Depression screen [facility] 2/9 05/14/2021 02/15/2020 02/01/2019  Decreased Interest 0 0 0  Down, Depressed, Hopeless 0 0 0  PHQ - 2 Score 0 0 0   Immunizations: (TDAP, Hep C screen, Pneumovax, Influenza, zoster)  Health Maintenance  Topic Date Due   HIV Screening  Never done   Hepatitis C Screening: USPSTF Recommendation to screen - Ages 71-79 yo.  Never done   COVID-19 Vaccine (3 - Booster for Moderna series) 01/18/2020   Mammogram  04/10/2021   Flu Shot  06/27/2021*   Zoster (Shingles) Vaccine (1 of 2) 08/11/2021*   Tetanus Vaccine  05/14/2022*   Pap Smear  01/31/2022   Colon Cancer Screening  05/02/2030   HPV Vaccine  Aged Out  *Topic was postponed. The date shown is not the original due date.   Fall Risk: Fall Risk  05/14/2021 11/26/2020 02/15/2020 11/06/2019 02/01/2019  Falls in the past year? 0 1 0 0 0  Number falls in past yr: 0 1 0 - -  Injury with Fall? 0 1 0 - -  Risk for fall due to : No Fall Risks History of fall(s) - - -  Follow up Falls evaluation completed Falls evaluation completed - - -   Advanced Directive: Advanced Directives 01/14/2021  Does Patient Have a Medical Advance Directive? No  Would patient like information on creating a medical advance directive? No - Patient declined    Medications and allergies reviewed with patient and updated if appropriate.  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     Semaglutide-Weight Management 0.5 MG/0.5ML SOAJ Inject 0.5 mg into the skin.     No current facility-administered medications on file prior to visit.    Past Medical History:  Diagnosis Date   Asthma    Past Surgical History:  Procedure Laterality Date   CYST REMOVAL TRUNK     HYSTERECTOMY ABDOMINAL WITH SALPINGECTOMY Bilateral 07/19/2019   Procedure: HYSTERECTOMY ABDOMINAL WITH SALPINGECTOMY;  Surgeon: Princess Bruins, MD;  Location: [place];  Service: Gynecology;  Laterality: Bilateral;  request 7:30am OR time in [place] block requests 1 1/2 hours   MOUTH SURGERY     Social History   Socioeconomic History   Marital status: Single    Spouse name: Not on file   Number of children: 0   Years of education: Not on file   Highest education level: Not on file  Occupational History   Not on file  Tobacco Use   Smoking status:  Never   Smokeless tobacco: Never  Vaping Use   Vaping Use: Never used  Substance and Sexual Activity   Alcohol use: Yes    Comment: SELDOM   Drug use: Never   Sexual activity: Yes    Partners: Male    Birth control/protection: Condom    Comment: 1st intercourse- 25, partners- 75, current partner- 3  yrs  Other Topics Concern   Not on file  Social History Narrative   Not on file   Social Determinants of Health   Financial Resource Strain: Not on file  Food Insecurity: Not on file  Transportation Needs: Not on file  Physical Activity: Not on file  Stress: Not on file   Social Connections: Not on file    Family History  Problem Relation Age of Onset   Hypertension Mother    Other Mother        [name]   Hypertension Father    Cancer Paternal Grandfather        lung cancer due to tobacco use   Cancer Maternal Grandmother        colon-        ROS  Objective:   Vitals:   05/14/21 1335  BP: 120/84  Pulse: 88  Temp: (!) 97.4 F (36.3 C)  SpO2: 98%   Body mass index is 36.23 kg/m.  Physical Examination:  Physical Exam Exam conducted with a chaperone present (scarring on labia majora due to HS, no purulent drainage, no erythema).  Constitutional:      General: She is not in acute distress.    Appearance: She is obese.  HENT:     Right Ear: Tympanic membrane, ear canal and external ear normal.     Left Ear: Tympanic membrane, ear canal and external ear normal.  Eyes:     General: No scleral icterus.    Extraocular Movements: Extraocular movements intact.     Conjunctiva/sclera: Conjunctivae normal.     Pupils: Pupils are equal, round, and reactive to light.  Neck:     Thyroid: No thyroid mass, thyromegaly or thyroid tenderness.      Comments: Cystic lesion on anterior neck, mobile, non tender, no erythema Cardiovascular:     Rate and Rhythm: Normal rate and regular rhythm.     Pulses: Normal pulses.     Heart sounds: Normal heart sounds.  Pulmonary:     Effort: Pulmonary effort is normal. No respiratory distress.     Breath sounds: Normal breath sounds.  Chest:  Breasts:    Breasts are symmetrical.     Right: Normal.     Left: Normal.  Abdominal:     General: Bowel sounds are normal. There is no distension.     Palpations: Abdomen is soft.     Hernia: There is no hernia in the left inguinal area or right inguinal area.  Genitourinary:    Exam position: Lithotomy position.     Labia:        Right: Lesion present. No tenderness.        Left: Lesion present. No tenderness.      Urethra: No prolapse.     Vagina:  Vaginal discharge and erythema present. No tenderness.     Uterus: Absent.      Adnexa:        Right: No mass, tenderness or fullness.         Left: No mass, tenderness or fullness.       Comments: S/p hysterectomy: uterus and  cervix absent Musculoskeletal:        General: Normal range of motion.     Cervical back: Normal range of motion and neck supple.     Right lower leg: No edema.     Left lower leg: No edema.  Lymphadenopathy:     Cervical: No cervical adenopathy.     Upper Body:     Right upper body: No supraclavicular, axillary or pectoral adenopathy.     Left upper body: No supraclavicular, axillary or pectoral adenopathy.     Lower Body: No right inguinal adenopathy. No left inguinal adenopathy.  Skin:    General: Skin is warm and dry.  Neurological:     Mental Status: She is alert and oriented to person, place, and time.  Psychiatric:        Mood and Affect: Mood normal.        Behavior: Behavior normal.        Thought Content: Thought content normal.   ASSESSMENT and PLAN: This visit occurred during the SARS-CoV-2 public health emergency.  Safety protocols were in place, including screening questions prior to the visit, additional usage of staff PPE, and extensive cleaning of exam room while observing appropriate contact time as indicated for disinfecting solutions.   Amellia was seen today for annual exam.  Diagnoses and all orders for this visit:  Preventative health care  Vitamin D deficiency -     Vitamin D (25 hydroxy)  Encounter for lipid screening for cardiovascular disease  Lymphadenopathy of head and neck -     US Soft Tissue Head/Neck (NON-THYROID); Future  Screen for STD (sexually transmitted disease) -     HIV antibody (with reflex) -     RPR -     Hepatitis C Antibody -     Cervicovaginal ancillary only( [place])  Encounter for Papanicolaou smear for cervical cancer screening -     Cytology - PAP  Breast cancer screening by mammogram -      MM 3D SCREEN BREAST BILATERAL; Future      Problem List Items Addressed This Visit   None Visit Diagnoses     Preventative health care    -  Primary   Vitamin D deficiency       Relevant Orders   Vitamin D (25 hydroxy)   Encounter for lipid screening for cardiovascular disease       Lymphadenopathy of head and neck       Relevant Orders   US Soft Tissue Head/Neck (NON-THYROID)   Screen for STD (sexually transmitted disease)       Relevant Orders   HIV antibody (with reflex)   RPR   Hepatitis C Antibody   Cervicovaginal ancillary only( [place])   Encounter for Papanicolaou smear for cervical cancer screening       Relevant Orders   Cytology - PAP   Breast cancer screening by mammogram       Relevant Orders   MM 3D SCREEN BREAST BILATERAL       Follow up: Return in about 1 year (around 05/14/2022) for CPE (fasting).  Wilfred Lacy, NP

## 2021-05-14 NOTE — Patient Instructions (Addendum)
Discuss possible use of methotrexate or rifampin to manage hidradenitis Suppurativa  Go to lab for blood draw  You will be contacted to schedule appt for mammogram and [place]  Preventive Care 65-50 Years Old, Female Preventive care refers to lifestyle choices and visits with your health care provider that can promote health and wellness. Preventive care visits are also called wellness exams. What can I expect for my preventive care visit? Counseling Your health care provider may ask you questions about your: Medical history, including: Past medical problems. Family medical history. Pregnancy history. Current health, including: Menstrual cycle. Method of birth control. Emotional well-being. Home life and relationship well-being. Sexual activity and sexual health. Lifestyle, including: Alcohol, nicotine or tobacco, and drug use. Access to firearms. Diet, exercise, and sleep habits. Work and work Statistician. Sunscreen use. Safety issues such as seatbelt and bike helmet use. Physical exam Your health care provider will check your: Height and weight. These may be used to calculate your BMI (body mass index). BMI is a measurement that tells if you are at a healthy weight. Waist circumference. This measures the distance around your waistline. This measurement also tells if you are at a healthy weight and may help predict your risk of certain diseases, such as type 2 diabetes and high blood pressure. Heart rate and blood pressure. Body temperature. Skin for abnormal spots. What immunizations do I need? Vaccines are usually given at various ages, according to a schedule. Your health care provider will recommend vaccines for you based on your age, medical history, and lifestyle or other factors, such as travel or where you work. What tests do I need? Screening Your health care provider may recommend screening tests for certain conditions. This may include: Lipid and cholesterol  levels. Diabetes screening. This is done by checking your blood sugar (glucose) after you have not eaten for a while (fasting). Pelvic exam and Pap test. Hepatitis B test. Hepatitis C test. HIV (human immunodeficiency virus) test. STI (sexually transmitted infection) testing, if you are at risk. Lung cancer screening. Colorectal cancer screening. Mammogram. Talk with your health care provider about when you should start having regular mammograms. This may depend on whether you have a family history of breast cancer. BRCA-related cancer screening. This may be done if you have a family history of breast, ovarian, tubal, or peritoneal cancers. Bone density scan. This is done to screen for osteoporosis. Talk with your health care provider about your test results, treatment options, and if necessary, the need for more tests. Follow these instructions at home: Eating and drinking  Eat a diet that includes fresh fruits and vegetables, whole grains, lean protein, and low-fat dairy products. Take vitamin and mineral supplements as recommended by your health care provider. Do not drink alcohol if: Your health care provider tells you not to drink. You are pregnant, may be pregnant, or are planning to become pregnant. If you drink alcohol: Limit how much you have to 0-1 drink a day. Know how much alcohol is in your drink. In the [place] one drink equals one 12 oz bottle of beer (355 mL), one 5 oz glass of wine (148 mL), or one 1 oz glass of hard liquor (44 mL). Lifestyle Brush your teeth every morning and night with fluoride toothpaste. Floss one time each day. Exercise for at least 30 minutes 5 or more days each week. Do not use any products that contain nicotine or tobacco. These products include cigarettes, chewing tobacco, and vaping devices, such as e-cigarettes. If  you need help quitting, ask your health care provider. Do not use drugs. If you are sexually active, practice safe sex. Use a  condom or other form of protection to prevent STIs. If you do not wish to become pregnant, use a form of birth control. If you plan to become pregnant, see your health care provider for a prepregnancy visit. Take aspirin only as told by your health care provider. Make sure that you understand how much to take and what form to take. Work with your health care provider to find out whether it is safe and beneficial for you to take aspirin daily. Find healthy ways to manage stress, such as: Meditation, yoga, or listening to music. Journaling. Talking to a trusted person. Spending time with friends and family. Minimize exposure to UV radiation to reduce your risk of skin cancer. Safety Always wear your seat belt while driving or riding in a vehicle. Do not drive: If you have been drinking alcohol. Do not ride with someone who has been drinking. When you are tired or distracted. While texting. If you have been using any mind-altering substances or drugs. Wear a helmet and other protective equipment during sports activities. If you have firearms in your house, make sure you follow all gun safety procedures. Seek help if you have been physically or sexually abused. What's next? Visit your health care provider once a year for an annual wellness visit. Ask your health care provider how often you should have your eyes and teeth checked. Stay up to date on all vaccines. This information is not intended to replace advice given to you by your health care provider. Make sure you discuss any questions you have with your health care provider. Document Revised: 09/11/2020 Document Reviewed: 09/11/2020 Elsevier Patient Education  [place].

## 2021-05-15 LAB — RPR: RPR Ser Ql: NONREACTIVE

## 2021-05-15 LAB — HIV ANTIBODY (ROUTINE TESTING W REFLEX): HIV 1&2 Ab, 4th Generation: NONREACTIVE

## 2021-05-15 LAB — VITAMIN D 25 HYDROXY (VIT D DEFICIENCY, FRACTURES): VITD: 15.54 ng/mL — ABNORMAL LOW (ref 30.00–100.00)

## 2021-05-15 LAB — HEPATITIS C ANTIBODY
Hepatitis C Ab: NONREACTIVE
SIGNAL TO CUT-OFF: 0.1 (ref <1.00)

## 2021-05-16 LAB — CERVICOVAGINAL ANCILLARY ONLY
Bacterial Vaginitis (gardnerella): POSITIVE — AB
Candida Glabrata: NEGATIVE
Candida Vaginitis: NEGATIVE
Chlamydia: NEGATIVE
Comment: NEGATIVE
Comment: NEGATIVE
Comment: NEGATIVE
Comment: NEGATIVE
Comment: NEGATIVE
Comment: NORMAL
Neisseria Gonorrhea: NEGATIVE
Trichomonas: NEGATIVE

## 2021-05-16 MED ORDER — VITAMIN D (ERGOCALCIFEROL) 1.25 MG (50000 UNIT) PO CAPS: 50000.0000 [IU] | ORAL_CAPSULE | ORAL | 0 refills | Status: AC

## 2021-05-16 MED ORDER — METRONIDAZOLE 0.75 % VA GEL: 1.0000 | Freq: Every day | VAGINAL | 0 refills | Status: AC

## 2021-05-16 NOTE — Addendum Note (Signed)
Addended by: Wilfred Lacy L on: 05/16/2021 01:58 PM   Modules accepted: Orders

## 2021-05-19 LAB — CYTOLOGY - PAP
Comment: NEGATIVE
Diagnosis: NEGATIVE
High risk HPV: NEGATIVE

## 2021-06-05 ENCOUNTER — Ambulatory Visit: Payer: BC Managed Care – PPO

## 2021-06-05 ENCOUNTER — Other Ambulatory Visit: Payer: BC Managed Care – PPO

## 2021-06-10 ENCOUNTER — Ambulatory Visit
Admission: RE | Admit: 2021-06-10 | Discharge: 2021-06-10 | Disposition: A | Discharge status: Home / Self Care | Payer: BC Managed Care – PPO | Source: Ambulatory Visit | Attending: Nurse Practitioner | Admitting: Nurse Practitioner

## 2021-06-10 DIAGNOSIS — R591 Generalized enlarged lymph nodes: Secondary | ICD-10-CM

## 2021-06-11 ENCOUNTER — Ambulatory Visit
Admission: RE | Admit: 2021-06-11 | Discharge: 2021-06-11 | Disposition: A | Discharge status: Home / Self Care | Payer: BC Managed Care – PPO | Source: Ambulatory Visit | Attending: Nurse Practitioner | Admitting: Nurse Practitioner

## 2021-06-11 ENCOUNTER — Other Ambulatory Visit: Payer: Self-pay

## 2021-06-11 DIAGNOSIS — Z1231 Encounter for screening mammogram for malignant neoplasm of breast: Secondary | ICD-10-CM

## 2021-06-12 NOTE — Progress Notes (Signed)
Normal, repeat in 1-51yr

## 2021-06-13 ENCOUNTER — Encounter: Payer: Self-pay | Admitting: Nurse Practitioner

## 2021-07-08 ENCOUNTER — Encounter: Payer: BC Managed Care – PPO | Admitting: Nurse Practitioner

## 2021-08-21 ENCOUNTER — Other Ambulatory Visit: Payer: Self-pay | Admitting: Nurse Practitioner

## 2021-08-21 DIAGNOSIS — E559 Vitamin D deficiency, unspecified: Secondary | ICD-10-CM

## 2023-05-21 ENCOUNTER — Telehealth: Payer: Self-pay

## 2023-05-21 NOTE — Transitions of Care (Post Inpatient/ED Visit) (Signed)
   05/21/2023  Name: Linda Chaney MRN: [id number] DOB: 04-01-1970  Today's TOC FU Call Status: Today's TOC FU Call Status:: Successful TOC FU Call Completed TOC FU Call Complete Date: 05/18/23 Patient's Name and Date of Birth confirmed.  Transition Care Management Follow-up Telephone Call Date of Discharge: 05/18/23 Discharge Facility:  ([facility]) Type of Discharge:  (Urgent Care) How have you been since you were released from the hospital?: Better Any questions or concerns?: No  Items Reviewed: Did you receive and understand the discharge instructions provided?: Yes Medications obtained,verified, and reconciled?: Yes (Medications Reviewed) Any new allergies since your discharge?: No Dietary orders reviewed?: NA Do you have support at home?: Yes People in Home: significant other, other relative(s)  Medications Reviewed Today: Medications Reviewed Today     Reviewed by Paulita Fujita, Elpidio Anis, CMA (Certified Medical Assistant) on 05/21/23 at 1126  Med List Status: <None>   Medication Order Taking? Sig Documenting Provider Last Dose Status Informant  albuterol (VENTOLIN HFA) 108 (90 Base) MCG/ACT inhaler [id number] Yes Inhale 1-2 puffs into the lungs every 4 (four) hours as needed. [provider] Taking Active   Cetirizine HCl (ZYRTEC ALLERGY PO) [id number] Yes Take 10 mg by mouth daily. [provider] Taking Active   clindamycin (CLEOCIN) 150 MG capsule [id number] No Take by mouth.  Patient not taking: Reported on 05/21/2023   [provider] Not Taking Active            Med Note Elyn Peers   Wed May 14, 2021  1:32 PM) PRN  metroNIDAZOLE (METROGEL VAGINAL) 0.75 % vaginal gel [id number] No Place 1 Applicatorful vaginally at bedtime.  Patient not taking: Reported on 05/21/2023   Anne Ng, NP Not Taking Active   phentermine (ADIPEX-P) 37.5 MG tablet [id number] Yes Take 37.5 mg by mouth daily. [provider] Taking Active    Semaglutide-Weight Management 0.5 MG/0.5ML SOAJ [id number] No Inject 0.5 mg into the skin.  Patient not taking: Reported on 05/21/2023   [provider] Not Taking Active   Vitamin D, Ergocalciferol, (DRISDOL) 1.25 MG (50000 UNIT) CAPS capsule [id number] No Take 1 capsule (50,000 Units total) by mouth every 7 (seven) days.  Patient not taking: Reported on 05/21/2023   Anne Ng, NP Not Taking Active             Home Care and Equipment/Supplies: Were Home Health Services Ordered?: No Any new equipment or medical supplies ordered?: No  Functional Questionnaire: Do you need assistance with bathing/showering or dressing?: No Do you need assistance with meal preparation?: No Do you need assistance with eating?: No Do you have difficulty maintaining continence: No Do you need assistance with getting out of bed/getting out of a chair/moving?: No Do you have difficulty managing or taking your medications?: No  Follow up appointments reviewed: PCP Follow-up appointment confirmed?: NA Specialist Hospital Follow-up appointment confirmed?: NA Do you need transportation to your follow-up appointment?: No Do you understand care options if your condition(s) worsen?: Yes-patient verbalized understanding    SIGNATURE BMS
